# Patient Record
Sex: Male | Born: 1955 | Race: White | Hispanic: No | Marital: Married | Smoking: Former smoker
Health system: Southern US, Community
[De-identification: ages and names within clinical notes are randomized; demographics above are authoritative.]

## PROBLEM LIST (undated history)

## (undated) DIAGNOSIS — Q600 Renal agenesis, unilateral: Secondary | ICD-10-CM

## (undated) DIAGNOSIS — C801 Malignant (primary) neoplasm, unspecified: Secondary | ICD-10-CM

## (undated) DIAGNOSIS — S060XAA Concussion with loss of consciousness status unknown, initial encounter: Secondary | ICD-10-CM

## (undated) HISTORY — PX: OTHER SURGICAL HISTORY: SHX169

## (undated) HISTORY — PX: TONSILLECTOMY: SUR1361

---

## 2013-08-07 ENCOUNTER — Emergency Department (HOSPITAL_COMMUNITY)
Admission: EM | Admit: 2013-08-07 | Discharge: 2013-08-07 | Disposition: A | Discharge status: Home / Self Care | Payer: BC Managed Care – PPO | Attending: Emergency Medicine | Admitting: Emergency Medicine

## 2013-08-07 ENCOUNTER — Emergency Department (HOSPITAL_COMMUNITY): Payer: BC Managed Care – PPO

## 2013-08-07 ENCOUNTER — Encounter (HOSPITAL_COMMUNITY): Payer: Self-pay | Admitting: Emergency Medicine

## 2013-08-07 DIAGNOSIS — R1031 Right lower quadrant pain: Secondary | ICD-10-CM | POA: Insufficient documentation

## 2013-08-07 DIAGNOSIS — Z79899 Other long term (current) drug therapy: Secondary | ICD-10-CM | POA: Insufficient documentation

## 2013-08-07 DIAGNOSIS — R1032 Left lower quadrant pain: Secondary | ICD-10-CM | POA: Insufficient documentation

## 2013-08-07 DIAGNOSIS — F17200 Nicotine dependence, unspecified, uncomplicated: Secondary | ICD-10-CM | POA: Insufficient documentation

## 2013-08-07 DIAGNOSIS — M549 Dorsalgia, unspecified: Secondary | ICD-10-CM | POA: Insufficient documentation

## 2013-08-07 DIAGNOSIS — R109 Unspecified abdominal pain: Secondary | ICD-10-CM

## 2013-08-07 DIAGNOSIS — R110 Nausea: Secondary | ICD-10-CM | POA: Insufficient documentation

## 2013-08-07 LAB — COMPREHENSIVE METABOLIC PANEL
ALK PHOS: 89 U/L (ref 39–117)
ALT: 17 U/L (ref 0–53)
AST: 16 U/L (ref 0–37)
Albumin: 4.2 g/dL (ref 3.5–5.2)
BUN: 13 mg/dL (ref 6–23)
CALCIUM: 9.2 mg/dL (ref 8.4–10.5)
CO2: 22 meq/L (ref 19–32)
Chloride: 102 mEq/L (ref 96–112)
Creatinine, Ser: 0.87 mg/dL (ref 0.50–1.35)
GFR calc Af Amer: >90 mL/min (ref >90)
GFR calc non Af Amer: >90 mL/min (ref >90)
Glucose, Bld: 136 mg/dL — ABNORMAL HIGH (ref 70–99)
POTASSIUM: 3.5 meq/L — AB (ref 3.7–5.3)
SODIUM: 140 meq/L (ref 137–147)
Total Bilirubin: 0.5 mg/dL (ref 0.3–1.2)
Total Protein: 7.2 g/dL (ref 6.0–8.3)

## 2013-08-07 LAB — CBC WITH DIFFERENTIAL/PLATELET
BASOS ABS: 0 10*3/uL (ref 0.0–0.1)
Basophils Relative: 0 % (ref 0–1)
EOS PCT: 0 % (ref 0–5)
Eosinophils Absolute: 0.1 10*3/uL (ref 0.0–0.7)
HCT: 51.3 % (ref 39.0–52.0)
Hemoglobin: 17.7 g/dL — ABNORMAL HIGH (ref 13.0–17.0)
LYMPHS PCT: 8 % — AB (ref 12–46)
Lymphs Abs: 1.5 10*3/uL (ref 0.7–4.0)
MCH: 31.8 pg (ref 26.0–34.0)
MCHC: 34.5 g/dL (ref 30.0–36.0)
MCV: 92.1 fL (ref 78.0–100.0)
Monocytes Absolute: 0.9 10*3/uL (ref 0.1–1.0)
Monocytes Relative: 5 % (ref 3–12)
NEUTROS ABS: 16.3 10*3/uL — AB (ref 1.7–7.7)
Neutrophils Relative %: 87 % — ABNORMAL HIGH (ref 43–77)
PLATELETS: 201 10*3/uL (ref 150–400)
RBC: 5.57 MIL/uL (ref 4.22–5.81)
RDW: 13 % (ref 11.5–15.5)
WBC: 18.7 10*3/uL — AB (ref 4.0–10.5)

## 2013-08-07 LAB — URINE MICROSCOPIC-ADD ON

## 2013-08-07 LAB — URINALYSIS, ROUTINE W REFLEX MICROSCOPIC
Bilirubin Urine: NEGATIVE
GLUCOSE, UA: NEGATIVE mg/dL
Ketones, ur: 40 mg/dL — AB
LEUKOCYTES UA: NEGATIVE
Nitrite: NEGATIVE
Protein, ur: 100 mg/dL — AB
Specific Gravity, Urine: 1.017 (ref 1.005–1.030)
Urobilinogen, UA: 0.2 mg/dL (ref 0.0–1.0)
pH: 6 (ref 5.0–8.0)

## 2013-08-07 LAB — LIPASE, BLOOD: Lipase: 41 U/L (ref 11–59)

## 2013-08-07 LAB — TROPONIN I: Troponin I: <0.3 ng/mL (ref <0.30)

## 2013-08-07 MED ORDER — IOHEXOL 300 MG/ML  SOLN
50.0000 mL | Freq: Once | INTRAMUSCULAR | Status: AC | PRN
  Administered 2013-08-07: 50 mL via ORAL

## 2013-08-07 MED ORDER — ONDANSETRON HCL 4 MG PO TABS: 4.0000 mg | ORAL_TABLET | Freq: Four times a day (QID) | ORAL | Status: DC

## 2013-08-07 MED ORDER — IOHEXOL 300 MG/ML  SOLN
100.0000 mL | Freq: Once | INTRAMUSCULAR | Status: AC | PRN
  Administered 2013-08-07: 100 mL via INTRAVENOUS

## 2013-08-07 MED ORDER — HYDROMORPHONE HCL PF 1 MG/ML IJ SOLN
0.5000 mg | Freq: Once | INTRAMUSCULAR | Status: AC
  Administered 2013-08-07: 0.5 mg via INTRAVENOUS
  Filled 2013-08-07: qty 1

## 2013-08-07 MED ORDER — SODIUM CHLORIDE 0.9 % IV BOLUS (SEPSIS)
1000.0000 mL | Freq: Once | INTRAVENOUS | Status: AC
  Administered 2013-08-07: 1000 mL via INTRAVENOUS

## 2013-08-07 MED ORDER — HYDROCODONE-ACETAMINOPHEN 5-325 MG PO TABS: 1.0000 | ORAL_TABLET | Freq: Four times a day (QID) | ORAL | Status: DC | PRN

## 2013-08-07 MED ORDER — ONDANSETRON HCL 4 MG/2ML IJ SOLN
4.0000 mg | Freq: Once | INTRAMUSCULAR | Status: AC
  Administered 2013-08-07: 4 mg via INTRAVENOUS
  Filled 2013-08-07: qty 2

## 2013-08-07 NOTE — ED Notes (Signed)
Pt states that he has a hx of lower back "problems" and states that he has had lower back pain x 2 days; pt states that the pain has gotten progressively worse and has began to radiate down his left leg; pt states that he was unable to get comfortable and began to have abd pain last pm; pt states last BM was 1/22 am and was normal; pt c/o nausea but denies vomiting; pt states that his abd feels "blocked" and states "it feels like nothing is getting by"

## 2013-08-07 NOTE — Discharge Instructions (Signed)
Please call your doctor for a followup appointment within 24-48 hours. When you talk to your doctor please let them know that you were seen in the emergency department and have them acquire all of your records so that they can discuss the findings with you and formulate a treatment plan to fully care for your new and ongoing problems. Please call and set-up an appointment with [facility] to be re-assessed Please rest and stay hydrated Please avoid foods that are high in fat increase Please take medications as prescribed rash while on pain medications his be no drinking alcohol, driving, operating any heavy machinery. If extra please dispose in a proper manner. Please do not take any Tylenol for this can lead to Tylenol overdose and liver issues. Please continue to monitor symptoms closely and if symptoms are to worsen or change (fever greater than 101, chills, neck pain, neck stiffness, chest pain, shortness of breath, difficulty breathing, nausea, vomiting, worsening stomach pain, blood in stools, black tarry stools, not passing gas, not passing stools) or if symptoms do not get better within 24-48 hours please report back to the ED immediately  Abdominal Pain, Adult Many things can cause abdominal pain. Usually, abdominal pain is not caused by a disease and will improve without treatment. It can often be observed and treated at home. Your health care provider will do a physical exam and possibly order blood tests and X-rays to help determine the seriousness of your pain. However, in many cases, more time must pass before a clear cause of the pain can be found. Before that point, your health care provider may not know if you need more testing or further treatment. HOME CARE INSTRUCTIONS  Monitor your abdominal pain for any changes. The following actions may help to alleviate any discomfort you are experiencing:  Only take over-the-counter or prescription medicines as directed by your health care  provider.  Do not take laxatives unless directed to do so by your health care provider.  Try a clear liquid diet (broth, tea, or water) as directed by your health care provider. Slowly move to a bland diet as tolerated. SEEK MEDICAL CARE IF:  You have unexplained abdominal pain.  You have abdominal pain associated with nausea or diarrhea.  You have pain when you urinate or have a bowel movement.  You experience abdominal pain that wakes you in the night.  You have abdominal pain that is worsened or improved by eating food.  You have abdominal pain that is worsened with eating fatty foods. SEEK IMMEDIATE MEDICAL CARE IF:   Your pain does not go away within 2 hours.  You have a fever.  You keep throwing up (vomiting).  Your pain is felt only in portions of the abdomen, such as the right side or the left lower portion of the abdomen.  You pass bloody or black tarry stools. MAKE SURE YOU:  Understand these instructions.   Will watch your condition.   Will get help right away if you are not doing well or get worse.  Document Released: 04/11/2005 Document Revised: 04/22/2013 Document Reviewed: 03/11/2013 [facility] Patient Information 2014 [place], [place].   Emergency Department Resource Guide 1) Find a Doctor and Pay Out of Pocket Although you won't have to find out who is covered by your insurance plan, it is a good idea to ask around and get recommendations. You will then need to call the office and see if the doctor you have chosen will accept you as a new patient  and what types of options they offer for patients who are self-pay. Some doctors offer discounts or will set up payment plans for their patients who do not have insurance, but you will need to ask so you aren't surprised when you get to your appointment.  2) Contact Your Local Health Department Not all health departments have doctors that can see patients for sick visits, but many do, so it is worth a call to see  if yours does. If you don't know where your local health department is, you can check in your phone book. The CDC also has a tool to help you locate your state's health department, and many state websites also have listings of all of their local health departments.  3) Find a Walk-in Clinic If your illness is not likely to be very severe or complicated, you may want to try a walk in clinic. These are popping up all over the country in pharmacies, drugstores, and shopping centers. They're usually staffed by nurse practitioners or physician assistants that have been trained to treat common illnesses and complaints. They're usually fairly quick and inexpensive. However, if you have serious medical issues or chronic medical problems, these are probably not your best option.  No Primary Care Doctor: - Call Health Connect at  [phone] - they can help you locate a primary care doctor that  accepts your insurance, provides certain services, etc. - Physician Referral Service- [phone]  Chronic Pain Problems: Organization         Address  Phone   Notes  [facility]  [phone] Patients need to be referred by their primary care doctor.   Medication Assistance: Organization         Address  Phone   Notes  [facility] Passistance Program [address] [place], [place] [address] [phone] --Must be a resident of [facility] -- Must have NO insurance coverage whatsoever (no Medicaid/ Medicare, etc.) -- The pt. MUST have a primary care doctor that directs their care regularly and follows them in the community   MedAssist  [phone]   [employer]  [phone]    Agencies that provide inexpensive medical care: Organization         Address  Phone   Notes  [facility]  [phone]   [facility]    [phone]   [facility] [address] [place], [place] [address] [phone]  [phone]   [facility] [address], [place] [phone]   Planned Parenthood    [phone]) [phone]   [facility]    [phone]   [facility]  [address], [place] Phone:  [phone], Fax:  [phone] Hours of Operation:  9 am - 6 pm, M-F.  Also accepts Medicaid/Medicare and self-pay.  [facility]  [address],  Phone: [phone], Fax: [phone]. Hours of Operation:  8:30 am - 5:30 pm, M-F.  Also accepts Medicaid and self-pay.  [facility] [place] [address], [place] Phone: [phone]) [phone]   [facility] [address], [place], [place] [phone], Ext. [phone] Mondays & Thursdays: 7-9 AM.  First 15 patients are seen on a first come, first serve basis.    Medicaid-accepting [facility] Providers:  Retail buyerrganization         Address  Phone  Notes  [facility] [address], [place] [phone] Also accepts self-pay patients.  [facility] [address], [place]  [phone]   [place], [address], [place] [phone]   [facility] [address], [place] [phone]   Lucianne Lei [address], [place]   [phone] Only accepts [place] Access [place] patients after they have their name applied to their card.   Self-Pay (no insurance) in [facility]:  Organization         Address  Phone   Notes  Sickle Cell Patients, [facility] [place] [phone]   [facility] [place] [phone]   [facility] [place]  [place], [place], [place] [phone]   [facility]/Dr. Osei-Bonsu  [address] [place] or [address], [place] [phone] Phone number for both [place] and [place] locations is the same.  Urgent Medical and [facility] [address], [place] [phone]   [facility] [address] [place] or [address] [phone]   [facility] [address], [place] [phone], phone; [phone], fax Sees patients 1st and 3rd Saturday of every month.  Must not qualify for public or private insurance (i.e. Medicaid, Medicare, [place] Health Choice, Veterans' Benefits)  Household income should be no more than 200% of the poverty level The clinic cannot treat you if you are pregnant or think you are pregnant  Sexually transmitted diseases are not treated at the clinic.    Dental Care: Organization         Address  Phone  Notes  [facility] [place] [phone] Accepts children up to age 77 who are enrolled in [place] or [place]; pregnant women with a Medicaid card; and children who have applied for Medicaid or [place] Health Choice, but were declined, whose parents can pay a reduced fee at time of service.  [facility] Department of [facility]  [address], [place] [phone] Accepts children up to age 71 who are enrolled in [place] or [place]; pregnant women with a Medicaid card; and children who have applied for Medicaid or [place] Health Choice, but were declined, whose parents can pay a reduced fee at time of service.  [place] Adult Dental Access PROGRAM  [place] [phone] Patients are seen by appointment only. Walk-ins are not accepted. [place] will see patients 65 years of age and older. Monday - Tuesday (8am-5pm) Most Wednesdays (8:30-5pm) $30 per visit, cash only  [facility] Adult Dental Access PROGRAM  [address], [facility] [phone] Patients are  seen by appointment only. Walk-ins are not accepted. [place] will see patients 80 years of age and older. One Wednesday Evening (Monthly: Volunteer Based).  $30 per visit, cash only  [place]  [phone] for adults; Children under age 54, call Graduate Pediatric Dentistry at [phone]. Children aged 68-14, please call [phone] to request a pediatric application.  Dental services are provided in all areas of dental care including fillings, crowns and bridges, complete  and partial dentures, implants, gum treatment, root canals, and extractions. Preventive care is also provided. Treatment is provided to both adults and children. Patients are selected via a lottery and there is often a waiting list.   [facility] [address] [place]  [phone] [email or web address]   [facility] [address] [place], [place] [phone], Ext. [phone] Second and Fourth Thursday of each month, opens at 6:30 AM; Clinic ends at 9 AM.  Patients are seen on a first-come first-served basis, and a limited number are seen during each clinic.   [facility]  [address] Hillard Danker [place], [place] [phone]   Eligibility Requirements You must have lived in [place], [place], or [place] counties for at least the last three months.   You cannot be eligible for state or federal sponsored [employer], including [employer], [place], or [employer].   You generally cannot be eligible for healthcare insurance through your employer.    How to apply: Eligibility screenings are held every Tuesday and Wednesday afternoon from 1:00 pm until 4:00 pm. You do not need an appointment for the interview!  [facility] [address] [place], [place]   [place]  [place] Department  [place]  [phone]     Behavioral Health Resources in the Community: Intensive Outpatient Programs Organization         Address  Phone  Notes  [place]. [address] [place], [place] [phone]   [facility] Outpatient [address], [place], [place]   ADS: Alcohol [facility] Drug S[facility] [address], [place], [place]   [place] [address]  [place], [place] or [phone]   Substance Abuse Resources Organization         Address  Phone  Notes  Alcohol and Drug Services  [phone]   [place]  [phone]   [facility] [place]   Chinita Pester  [phone]   Residential & Outpatient Substance Abuse Program  [phone]   Psychological Services Organization         Address  Phone  Notes  [facility] [place]  [place]  [phone]   [place] [address], [place] or [phone]    Mobile Crisis Teams Organization         Address  Phone  Notes  [facility], Mobile Crisis Care Unit  [phone]   [facility] Psychotherapeutic Services  [address] [place], [place]   [place] [address] [place] [phone]    Self-Help/Support Groups Organization         Address  Phone             Notes  [place]. of [place] - variety of support groups  [place] Call for more information  Narcotics Anonymous (NA), [facility] [address], [employer] [place]  2 meetings at this location   Special educational needs teacher         Address  Phone  Notes  [facility] Residential Treatment [place],    [place]  [place]  [address] [place] 629476, [place], [place]   [place], [place] [phone] Admissions: 8am-3pm M-F  Incentives  Substance [place] [address]    [place], [place] [phone]   [facility] [address] [place], [place], [place]   [facility] [address]  [place], [place]   [facility]ight Programs - Intensive Outpatient [address] Kristeen [address], [place], [place]   [facility].,  [place], [place] [phone] or [phone]   [facility] Services (RTS) [address], [place], [place] Accepts Medicaid  [facility] [address],  [place] [phone] Substance Abuse/Addiction Treatment   [facility] Organization         Address  Phone  Notes  [facility]  [phone]   Domenic Schwab, PhD [address] Arlis Porta [place], [place]   [phone] or [phone]   [place] Clarissa [place], [place] [phone]   [facility] [address], [place], [place] [phone] Insurance/Medicaid/sponsorship through [facility] and Families [address], [address] [place]                                    [place], [place] [phone] [place] [address], [place] [phone]    Dr. Adele Schilder  [phone]   [facility] Dept. 1) [address]  2) [place] 3)  [place] 65, [place] [phone]  [phone]   [place] [phone] or [phone] (After Hours)

## 2013-08-07 NOTE — ED Provider Notes (Signed)
CSN: [id number]     Arrival date & time 08/07/13  0540 History   First MD Initiated Contact with Patient 08/07/13 [phone]     Chief Complaint  Patient presents with  . Back Pain  . Abdominal Pain   (Consider location/radiation/quality/duration/timing/severity/associated sxs/prior Treatment) The history is provided by the patient. No language interpreter was used.  Jerquan Bleyer is a 58 year old male with no known significant past medical history presenting to emergency department with abdominal pain and back pain that started on Tuesday. Patient reported that he has history of intermittent back pain, reported that he "damaged" his lower back a couple of years ago. Stated that the back pain at first was described as a "twinge" localized to the left side, but has now increased to sharp pain going up his back and radiating down his left leg posteriorly. Patient reported his abdominal pain is described as a tightness, patient reported that "there is a blockage" - reported that he has not heard any "gurgling." Reported that the pain is localized to the left lower quadrant. Patient reported that the back pain radiates to the abdomen. Stated that he's been using ibuprofen with minimal relief. Stated that he had a normal bowel movement yesterday, 08/06/2013. Denied melena, constipation, hematochezia, emesis, chest pain, shortness of breath, difficulty breathing, dizziness, kidney stone history. PCP Dr. Deatra Ina  History reviewed. No pertinent past medical history. History reviewed. No pertinent past surgical history. History reviewed. No pertinent family history. History  Substance Use Topics  . Smoking status: Current Every Day Smoker -- 1.00 packs/day    Types: Cigarettes  . Smokeless tobacco: Not on file  . Alcohol Use: Yes     Comment: occc    Review of Systems  Constitutional: Negative for fever and chills.  Respiratory: Negative for chest tightness and shortness of breath.   Cardiovascular:  Negative for chest pain.  Gastrointestinal: Positive for nausea and abdominal pain. Negative for vomiting, diarrhea, constipation, blood in stool and anal bleeding.  Musculoskeletal: Positive for back pain. Negative for myalgias.  Neurological: Negative for dizziness, weakness and headaches.  All other systems reviewed and are negative.    Allergies  Review of patient's allergies indicates no known allergies.  Home Medications   Current Outpatient Rx  Name  Route  Sig  Dispense  Refill  . HYDROcodone-acetaminophen (NORCO/VICODIN) 5-325 MG per tablet   Oral   Take 1 tablet by mouth every 6 (six) hours as needed for moderate pain.   5 tablet   0   . ondansetron (ZOFRAN) 4 MG tablet   Oral   Take 1 tablet (4 mg total) by mouth every 6 (six) hours.   12 tablet   0    BP 123/66  Pulse 56  Temp(Src) 98 F (36.7 C) (Oral)  Resp 16  Ht 5\' 8"  (1.727 m)  Wt 155 lb (70.308 kg)  BMI 23.57 kg/m2  SpO2 96% Physical Exam  Nursing note and vitals reviewed. Constitutional: He is oriented to person, place, and time. He appears well-developed and well-nourished. No distress.  HENT:  Head: Normocephalic and atraumatic.  Mouth/Throat: No oropharyngeal exudate.  Dry mucous membranes  Eyes: Conjunctivae and EOM are normal. Pupils are equal, round, and reactive to light. Right eye exhibits no discharge. Left eye exhibits no discharge.  Neck: Normal range of motion. Neck supple.  Negative neck stiffness Negative nuchal rigidity Negative meningeal sign  Cardiovascular: Normal rate, regular rhythm and normal heart sounds.  Exam reveals no friction rub.  No murmur heard. Pulses:      Radial pulses are 2+ on the right side, and 2+ on the left side.       Dorsalis pedis pulses are 2+ on the right side, and 2+ on the left side.  Pulmonary/Chest: Effort normal and breath sounds normal. No respiratory distress. He has no wheezes. He has no rales.  Abdominal: Soft. Normal appearance and bowel  sounds are normal. There is tenderness in the right lower quadrant and left lower quadrant. There is guarding. There is no rigidity and negative Murphy's sign.    Bowel sounds normoactive Tenderness upon palpation to right lower and left lower quadrant, most discomfort upon palpation to left lower quadrant Soft upon palpation  Musculoskeletal: Normal range of motion. He exhibits tenderness.       Back:  Negative swelling, erythema, inflammation, ecchymosis, bulging, deformities noted to cervical/thoracic/lumbosacral/coccyx of the mid spine. Discomfort upon palpation to lumbosacral/coccyx the mid spine-discomfort upon the left paraspinal region. Full range of motion to lower extremities without difficulty or ataxia noted. Full range of motion to upper extremities bilaterally.  Lymphadenopathy:    He has no cervical adenopathy.  Neurological: He is alert and oriented to person, place, and time. No cranial nerve deficit. He exhibits normal muscle tone. Coordination normal.  Cranial nerves III-XII grossly intact Strength 5+/5+ to upper and lower extremities bilaterally with resistance applied, equal distribution noted Sensation intact to upper and lower extremities bilaterally with differentiation to sharp and dull touch  Skin: Skin is warm and dry. No rash noted. He is not diaphoretic. No erythema.  Psychiatric: He has a normal mood and affect. His behavior is normal. Thought content normal.    ED Course  Procedures (including critical care time)  9:03 AM This provider re-assessed patient. Reported that his back pain has improved, but his abdomen still continues to feel "full."   11:06 AM This provider re-assessed the patient. Patient appears to be doing well. Discussed labs in great detail - patient understood. Patient reported abdominal pain of 5/10 and back pain of 7/10. Reported that dilaudid aided in his relief.   12:37 PM This provider re-assessed patient. Patient reported that he no  longer had any pain. BS normoactive and negative pain upon palpation. Patient reported that he has been passing gas and feels better. Patient able to tolerate food and fluids PO without difficulty or episodes of emesis. Denied chest pain, shortness of breath, difficulty breathing, nausea, vomiting, diarrhea.   Results for orders placed during the hospital encounter of 08/07/13  CBC WITH DIFFERENTIAL      Result Value Range   WBC 18.7 (*) 4.0 - 10.5 K/uL   RBC 5.57  4.22 - 5.81 MIL/uL   Hemoglobin 17.7 (*) 13.0 - 17.0 g/dL   HCT 51.3  39.0 - 52.0 %   MCV 92.1  78.0 - 100.0 fL   MCH 31.8  26.0 - 34.0 pg   MCHC 34.5  30.0 - 36.0 g/dL   RDW 13.0  11.5 - 15.5 %   Platelets 201  150 - 400 K/uL   Neutrophils Relative % 87 (*) 43 - 77 %   Neutro Abs 16.3 (*) 1.7 - 7.7 K/uL   Lymphocytes Relative 8 (*) 12 - 46 %   Lymphs Abs 1.5  0.7 - 4.0 K/uL   Monocytes Relative 5  3 - 12 %   Monocytes Absolute 0.9  0.1 - 1.0 K/uL   Eosinophils Relative 0  0 - 5 %  Eosinophils Absolute 0.1  0.0 - 0.7 K/uL   Basophils Relative 0  0 - 1 %   Basophils Absolute 0.0  0.0 - 0.1 K/uL  COMPREHENSIVE METABOLIC PANEL      Result Value Range   Sodium 140  137 - 147 mEq/L   Potassium 3.5 (*) 3.7 - 5.3 mEq/L   Chloride 102  96 - 112 mEq/L   CO2 22  19 - 32 mEq/L   Glucose, Bld 136 (*) 70 - 99 mg/dL   BUN 13  6 - 23 mg/dL   Creatinine, Ser 0.87  0.50 - 1.35 mg/dL   Calcium 9.2  8.4 - 10.5 mg/dL   Total Protein 7.2  6.0 - 8.3 g/dL   Albumin 4.2  3.5 - 5.2 g/dL   AST 16  0 - 37 U/L   ALT 17  0 - 53 U/L   Alkaline Phosphatase 89  39 - 117 U/L   Total Bilirubin 0.5  0.3 - 1.2 mg/dL   GFR calc non Af Amer >90  >90 mL/min   GFR calc Af Amer >90  >90 mL/min  LIPASE, BLOOD      Result Value Range   Lipase 41  11 - 59 U/L  TROPONIN I      Result Value Range   Troponin I <0.30  <0.30 ng/mL  URINALYSIS, ROUTINE W REFLEX MICROSCOPIC      Result Value Range   Color, Urine YELLOW  YELLOW   APPearance CLEAR  CLEAR     Specific Gravity, Urine 1.017  1.005 - 1.030   pH 6.0  5.0 - 8.0   Glucose, UA NEGATIVE  NEGATIVE mg/dL   Hgb urine dipstick SMALL (*) NEGATIVE   Bilirubin Urine NEGATIVE  NEGATIVE   Ketones, ur 40 (*) NEGATIVE mg/dL   Protein, ur 100 (*) NEGATIVE mg/dL   Urobilinogen, UA 0.2  0.0 - 1.0 mg/dL   Nitrite NEGATIVE  NEGATIVE   Leukocytes, UA NEGATIVE  NEGATIVE  URINE MICROSCOPIC-ADD ON      Result Value Range   Squamous Epithelial / LPF RARE  RARE   Bacteria, UA RARE  RARE   Ct Abdomen Pelvis W Contrast  08/07/2013   CLINICAL DATA:  Low back pain, abdominal pain, nausea, elevated white count  EXAM: CT ABDOMEN AND PELVIS WITH CONTRAST  TECHNIQUE: Multidetector CT imaging of the abdomen and pelvis was performed using the standard protocol following bolus administration of intravenous contrast.  CONTRAST:  118mL OMNIPAQUE IOHEXOL 300 MG/ML  SOLN  COMPARISON:  None.  FINDINGS: Minor dependent basilar atelectasis. Lung bases clear. No visualized lower lobe pneumonia. No pleural or pericardial effusion. Normal heart size. No hiatal hernia.  Abdomen: Liver, gallbladder, biliary system, pancreas, spleen, and adrenal glands are within normal limits for age and demonstrate no acute process. Solitary left kidney noted with secondary hypertrophy. No renal obstruction or hydronephrosis. Incidental sub cm left renal cortical cysts noted in the mid to lower pole regions.  No abdominal free fluid, fluid collection, hemorrhage, abscess, or adenopathy.  Negative for bowel obstruction, dilatation, ileus, or free air.  Normal appendix demonstrated.  Aortic atherosclerosis noted without aneurysm. No acute or abnormal vascular finding.  Pelvis: No pelvic free fluid, fluid collection, hemorrhage, abscess, adenopathy, inguinal abnormality, or hernia. Prostate gland is mildly enlarged. No acute distal bowel process.  Minor degenerative changes of the lower lumbar spine with facet arthropathy.  IMPRESSION: No acute  intra-abdominal or pelvic finding.  Solitary left kidney with small incidental sub  cm cortical cysts  Aortic atherosclerosis without aneurysm  Basilar atelectasis   Electronically Signed   By: Daryll Brod M.D.   On: 08/07/2013 09:02   US Aorta  08/07/2013   CLINICAL DATA:  Pain radiating to back  EXAM: ULTRASOUND OF ABDOMINAL AORTA  TECHNIQUE: Ultrasound examination of the abdominal aorta was performed to evaluate for potential abdominal aortic aneurysm.  COMPARISON:  None.  FINDINGS: The abdominal aorta has a maximum transverse diameter of 2.5 x 2.0 cm. There is no abdominal aortic aneurysm. There is no periaortic fluid or adenopathy. Gas obscures the proximal iliac arteries.  IMPRESSION: No demonstrable abdominal aortic aneurysm.   Electronically Signed   By: Lowella Grip M.D.   On: 08/07/2013 07:56   Labs Review Labs Reviewed  CBC WITH DIFFERENTIAL - Abnormal; Notable for the following:    WBC 18.7 (*)    Hemoglobin 17.7 (*)    Neutrophils Relative % 87 (*)    Neutro Abs 16.3 (*)    Lymphocytes Relative 8 (*)    All other components within normal limits  COMPREHENSIVE METABOLIC PANEL - Abnormal; Notable for the following:    Potassium 3.5 (*)    Glucose, Bld 136 (*)    All other components within normal limits  URINALYSIS, ROUTINE W REFLEX MICROSCOPIC - Abnormal; Notable for the following:    Hgb urine dipstick SMALL (*)    Ketones, ur 40 (*)    Protein, ur 100 (*)    All other components within normal limits  LIPASE, BLOOD  TROPONIN I  URINE MICROSCOPIC-ADD ON   Imaging Review Ct Abdomen Pelvis W Contrast  08/07/2013   CLINICAL DATA:  Low back pain, abdominal pain, nausea, elevated white count  EXAM: CT ABDOMEN AND PELVIS WITH CONTRAST  TECHNIQUE: Multidetector CT imaging of the abdomen and pelvis was performed using the standard protocol following bolus administration of intravenous contrast.  CONTRAST:  143mL OMNIPAQUE IOHEXOL 300 MG/ML  SOLN  COMPARISON:  None.  FINDINGS:  Minor dependent basilar atelectasis. Lung bases clear. No visualized lower lobe pneumonia. No pleural or pericardial effusion. Normal heart size. No hiatal hernia.  Abdomen: Liver, gallbladder, biliary system, pancreas, spleen, and adrenal glands are within normal limits for age and demonstrate no acute process. Solitary left kidney noted with secondary hypertrophy. No renal obstruction or hydronephrosis. Incidental sub cm left renal cortical cysts noted in the mid to lower pole regions.  No abdominal free fluid, fluid collection, hemorrhage, abscess, or adenopathy.  Negative for bowel obstruction, dilatation, ileus, or free air.  Normal appendix demonstrated.  Aortic atherosclerosis noted without aneurysm. No acute or abnormal vascular finding.  Pelvis: No pelvic free fluid, fluid collection, hemorrhage, abscess, adenopathy, inguinal abnormality, or hernia. Prostate gland is mildly enlarged. No acute distal bowel process.  Minor degenerative changes of the lower lumbar spine with facet arthropathy.  IMPRESSION: No acute intra-abdominal or pelvic finding.  Solitary left kidney with small incidental sub cm cortical cysts  Aortic atherosclerosis without aneurysm  Basilar atelectasis   Electronically Signed   By: Daryll Brod M.D.   On: 08/07/2013 09:02   US Aorta  08/07/2013   CLINICAL DATA:  Pain radiating to back  EXAM: ULTRASOUND OF ABDOMINAL AORTA  TECHNIQUE: Ultrasound examination of the abdominal aorta was performed to evaluate for potential abdominal aortic aneurysm.  COMPARISON:  None.  FINDINGS: The abdominal aorta has a maximum transverse diameter of 2.5 x 2.0 cm. There is no abdominal aortic aneurysm. There is no periaortic fluid or  adenopathy. Gas obscures the proximal iliac arteries.  IMPRESSION: No demonstrable abdominal aortic aneurysm.   Electronically Signed   By: Lowella Grip M.D.   On: 08/07/2013 07:56    EKG Interpretation    Date/Time:  Friday August 07 2013 07:14:52  EST Ventricular Rate:  63 PR Interval:  124 QRS Duration: 112 QT Interval:  430 QTC Calculation: 440 R Axis:   110 Text Interpretation:  Sinus arrhythmia Biatrial enlargement IRBBB and LPFB Confirmed by ALLEN  MD, ANTHONY ([id number]) on 08/07/2013 9:53:38 AM            MDM   1. Abdominal pain   2. Back pain     Medications  ondansetron (ZOFRAN) injection 4 mg (4 mg Intravenous Given 08/07/13 0626)  sodium chloride 0.9 % bolus 1,000 mL (0 mLs Intravenous Stopped 08/07/13 0920)  HYDROmorphone (DILAUDID) injection 0.5 mg (0.5 mg Intravenous Given 08/07/13 0714)  iohexol (OMNIPAQUE) 300 MG/ML solution 50 mL (50 mLs Oral Contrast Given 08/07/13 0741)  iohexol (OMNIPAQUE) 300 MG/ML solution 100 mL (100 mLs Intravenous Contrast Given 08/07/13 0839)  HYDROmorphone (DILAUDID) injection 0.5 mg (0.5 mg Intravenous Given 08/07/13 1128)   Filed Vitals:   08/07/13 0541 08/07/13 0904 08/07/13 1235  BP: 153/86 127/55 123/66  Pulse: 60 68 56  Temp: 98 F (36.7 C) 98.6 F (37 C) 98 F (36.7 C)  TempSrc: Oral Oral Oral  Resp: 16 14 16   Height: 5\' 8"  (1.727 m)    Weight: 155 lb (70.308 kg)    SpO2: 98% 95% 96%    Patient presenting to emergency department with abdominal pain and back pain that started on Tuesday. Reported that the back pain started off as a "twinge" that has now increased to a sharp shooting pain radiating up and down the left posterior aspect of the leg. Reported abdominal pain is described as a tightness-reported that he feels as if "there is a blockage." Stated that he's been using ibuprofen with minimal relief. Stated that he has not had any gas today. Reported normal bowel movement yesterday. Alert and oriented. GCS 15. Heart rate and rhythm normal. Lungs clear to auscultation to upper and lower lobes bilaterally. Radial and DP pulses 2+ bilaterally. Bowel sounds normoactive in all 4 quadrants-soft-discomfort upon palpation to right and left lower quadrant, most discomfort upon  palpation to left lower quadrant. Negative Murphy's. Negative McBurney's. Full range of motion to upper and lower extremities bilaterally without difficulty noted. Negative ataxia. Negative deformities noted to the lumbar spine. Discomfort upon palpation to the mid lumbosacral spine/coccyx and left paraspinal region of the lumbosacral spine. Full range of motion to lower extremities. Strength intact with resistance applied, equal distribution noted. Sensation intact. Patient neurologically intact. Urinalysis noted small amount of hemoglobin-negative nitrites or leukocytes identified. CBC noted mild elevation of white blood cells to be 18.7, elevation of neutrophils to be 16.3. CMP noted hypokalemia of 3.5. Lipase negative elevation. Ultrasound of the aorta negative for aneurysm or dissection. CT abdomen and pelvis with contrast negative acute abnormalities identified-solitary left kidney with small incidental subcentimeter cortical cysts noted. Doubt AAA. Doubt acute abdominal processes. Doubt appendicitis. Doubt colitis. Doubt SBO/LBO. Patient able to tolerate fluids and food PO without episode of emesis. Patient re-assessed and pain relieved. Patient reported abdominal pain has improved. Nonsurgical abdomen. Patient stable, afebrile. Pain controlled in ED setting. Negative episodes of emesis while in ED setting. Discharged patient. Definitive etiology of abdominal pain unknown - possible discomfort secondary to back pain with radiation to abdominal  pain. Hematuria identified-cannot rule out kidney stone. Discharge patient with small dose of pain medications discussed course, precautions, disposal technique. Referred patient to primary care provider and GI. Discussed with patient to rest and stay hydrated. Discussed with patient proper diet. Discussed with patient to closely monitor symptoms and if symptoms are to worsen or change to report back to the ED - strict return instructions given.  Patient agreed to  plan of care, understood, all questions answered.    Jamse Mead, PA-C 08/08/13 1657  Creta Dorame, PA-C 08/09/13 1554

## 2013-08-07 NOTE — ED Notes (Signed)
Pt given cup of ice water 

## 2013-08-14 NOTE — ED Provider Notes (Signed)
Medical screening examination/treatment/procedure(s) were performed by non-physician practitioner and as supervising physician I was immediately available for consultation/collaboration.  Tallulah Hosman T Bettyanne Dittman, MD 08/14/13 0936 

## 2015-09-12 ENCOUNTER — Other Ambulatory Visit: Payer: Self-pay | Admitting: Occupational Medicine

## 2015-09-12 ENCOUNTER — Ambulatory Visit: Payer: Self-pay

## 2015-09-12 DIAGNOSIS — Z0000 Encounter for general adult medical examination without abnormal findings: Secondary | ICD-10-CM

## 2017-11-20 ENCOUNTER — Ambulatory Visit
Admission: RE | Admit: 2017-11-20 | Discharge: 2017-11-20 | Disposition: A | Discharge status: Home / Self Care | Payer: Managed Care, Other (non HMO) | Source: Ambulatory Visit | Attending: Family Medicine | Admitting: Family Medicine

## 2017-11-20 ENCOUNTER — Other Ambulatory Visit: Payer: Self-pay | Admitting: Family Medicine

## 2017-11-20 DIAGNOSIS — M25512 Pain in left shoulder: Secondary | ICD-10-CM

## 2019-02-17 ENCOUNTER — Other Ambulatory Visit: Payer: Self-pay | Admitting: Family Medicine

## 2019-02-17 DIAGNOSIS — R2241 Localized swelling, mass and lump, right lower limb: Secondary | ICD-10-CM

## 2021-01-12 ENCOUNTER — Other Ambulatory Visit (HOSPITAL_COMMUNITY): Payer: Self-pay | Admitting: Urology

## 2021-01-12 DIAGNOSIS — C61 Malignant neoplasm of prostate: Secondary | ICD-10-CM

## 2021-02-03 ENCOUNTER — Ambulatory Visit (HOSPITAL_COMMUNITY)
Admission: RE | Admit: 2021-02-03 | Discharge: 2021-02-03 | Disposition: A | Discharge status: Home / Self Care | Payer: Medicare Other | Source: Ambulatory Visit | Attending: Urology | Admitting: Urology

## 2021-02-03 ENCOUNTER — Other Ambulatory Visit: Payer: Self-pay

## 2021-02-03 DIAGNOSIS — C61 Malignant neoplasm of prostate: Secondary | ICD-10-CM | POA: Diagnosis not present

## 2021-02-03 MED ORDER — PIFLIFOLASTAT F 18 (PYLARIFY) INJECTION
9.0000 | Freq: Once | INTRAVENOUS | Status: AC
  Administered 2021-02-03: 9.6 via INTRAVENOUS

## 2021-02-10 ENCOUNTER — Telehealth: Payer: Self-pay | Admitting: Radiation Oncology

## 2021-02-10 NOTE — Telephone Encounter (Signed)
Called patient to schedule consultation with Dr. Tammi Klippel. No answer, LVM for return call.

## 2021-02-13 ENCOUNTER — Other Ambulatory Visit: Payer: Self-pay | Admitting: Urology

## 2021-02-13 DIAGNOSIS — C61 Malignant neoplasm of prostate: Secondary | ICD-10-CM

## 2021-03-07 ENCOUNTER — Ambulatory Visit
Admission: RE | Admit: 2021-03-07 | Discharge: 2021-03-07 | Disposition: A | Discharge status: Home / Self Care | Payer: Medicare Other | Source: Ambulatory Visit | Attending: Urology | Admitting: Urology

## 2021-03-07 ENCOUNTER — Other Ambulatory Visit: Payer: Self-pay

## 2021-03-07 DIAGNOSIS — C61 Malignant neoplasm of prostate: Secondary | ICD-10-CM

## 2021-03-07 MED ORDER — GADOBENATE DIMEGLUMINE 529 MG/ML IV SOLN
17.0000 mL | Freq: Once | INTRAVENOUS | Status: AC | PRN
  Administered 2021-03-07: 17 mL via INTRAVENOUS

## 2021-03-07 NOTE — Progress Notes (Addendum)
GU Location of Tumor / Histology:  Adenocarcinoma of the prostate  If Prostate Cancer, Gleason Score is (4 + 3), PSA (36 as of 12/13/2020), and Prostate volume (45 g)  Andrew Hudson presented with signs/symptoms of: elevated PSA levels  Biopsies revealed:  01/03/2021   MRI Pelvis w/ & w/o Contrast 03/07/2021 --IMPRESSION: Ill-defined low T1 signal lesion in the posterior right iliac bone with minimal enhancement. This corresponds to the lesion seen on recent PET-CT. Imaging characteristics are nonspecific but metastatic disease is in the differential. NM bone scan would be useful for further evaluation. Low T2 signal lesion within the peripheral and transitional zone ofthe prostate gland compatible with prostate carcinoma.  Past/Anticipated interventions by urology, if any:  02/09/2021 (office visit) --Dr. Rexene Alberts   01/03/2021 --Dr. Rexene Alberts Transrectal ultrasound of prostate with biopsies   Past/Anticipated interventions by medical oncology, if any:  No referral placed at this time  Weight changes, if any: Patient denies  IPSS Score: 3 (mild) SHIM Score: 25 (no ED)  Bowel/Bladder complaints, if any: Denies any changes in bowel habits. Denies any new or worsening urinary symptoms. Reports he would be pleased if he had to live with his current urinary condition for the rest of his life  Nausea/Vomiting, if any: Patient denies  Pain issues, if any:  Patient denies  SAFETY ISSUES: Prior radiation? No Pacemaker/ICD? No Possible current pregnancy? N/A Is the patient on methotrexate? No  Current Complaints / other details:  Has received the first 3 Pfizer vaccines. Reports his sister who is ~61 years older than him was recently diagnosed with breast cancer

## 2021-03-08 ENCOUNTER — Encounter: Payer: Self-pay | Admitting: Radiation Oncology

## 2021-03-08 ENCOUNTER — Ambulatory Visit
Admission: RE | Admit: 2021-03-08 | Discharge: 2021-03-08 | Disposition: A | Discharge status: Home / Self Care | Payer: Medicare Other | Source: Ambulatory Visit | Attending: Radiation Oncology | Admitting: Radiation Oncology

## 2021-03-08 DIAGNOSIS — R609 Edema, unspecified: Secondary | ICD-10-CM | POA: Insufficient documentation

## 2021-03-08 DIAGNOSIS — C61 Malignant neoplasm of prostate: Secondary | ICD-10-CM

## 2021-03-08 DIAGNOSIS — F17210 Nicotine dependence, cigarettes, uncomplicated: Secondary | ICD-10-CM | POA: Diagnosis not present

## 2021-03-08 NOTE — Progress Notes (Signed)
Radiation Oncology         [phone]) [phone] ________________________________  Initial Outpatient Consultation  Name: Andrew Hudson MRN: [id number]  Date: 03/08/2021  DOB: 04/14/1956  [id number], [name], PA-C  [name], MD   REFERRING PHYSICIAN: [name], MD  DIAGNOSIS: 65 y.o. 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    ICD-10-CM   1. Malignant neoplasm of prostate ([place])  C61       HISTORY OF PRESENT ILLNESS: Andrew Hudson is a 65 y.o. male with a diagnosis of prostate cancer. He was noted to have an elevated PSA of 38 by his primary care provider, Andrew Matter, PA-C on labs drawn 09/05/2020.  Accordingly, he was referred for evaluation in urology by Dr. Abner Greenspan on 12/13/2020,  digital rectal examination was performed at that time revealing firm nodules bilaterally in the prostate.  The patient proceeded to transrectal ultrasound with 12 biopsies of the prostate on 01/03/2021.  The prostate volume measured 45 cc.  Out of 12 core biopsies, 11 were positive.  The maximum Gleason score was 4+3, and this was seen in 9 of the 11 positive cores and Gleason 3+4 was seen in the other 2 of the 11 positive cores. A PSMA PET scan was performed for disease staging on 02/03/2021 and demonstrated intense activity in the prostate gland without any concerning lymphadenopathy.  There was a questionable lesion in the right iliac and left third rib with low activity and the recommendation was for further evaluation with MRI pelvis.  The MRI pelvis was performed on 03/07/2021 and again demonstrated low activity in the right iliac lesion, felt to be indeterminant.  The patient reviewed the biopsy results with his urologist and he has kindly been referred today for discussion of potential radiation treatment options.   PREVIOUS RADIATION THERAPY: No  PAST MEDICAL HISTORY: No past medical history on file.    PAST SURGICAL HISTORY:No past surgical  history on file.  FAMILY HISTORY: No family history on file.  SOCIAL HISTORY:  Social History   Socioeconomic History   Marital status: Married    Spouse name: Not on file   Number of children: Not on file   Years of education: Not on file   Highest education level: Not on file  Occupational History   Not on file  Tobacco Use   Smoking status: Every Day    Packs/day: 1.00    Types: Cigarettes   Smokeless tobacco: Not on file  Substance and Sexual Activity   Alcohol use: Yes    Comment: occc   Drug use: Yes    Types: Marijuana   Sexual activity: Not on file  Other Topics Concern   Not on file  Social History Narrative   Not on file   Social Determinants of Health   Financial Resource Strain: Not on file  Food Insecurity: Not on file  Transportation Needs: Not on file  Physical Activity: Not on file  Stress: Not on file  Social Connections: Not on file  Intimate Partner Violence: Not on file    ALLERGIES: Patient has no known allergies.  MEDICATIONS:  Current Outpatient Medications  Medication Sig Dispense Refill   HYDROcodone-acetaminophen (NORCO/VICODIN) 5-325 MG per tablet Take 1 tablet by mouth every 6 (six) hours as needed for moderate pain. 5 tablet 0   ondansetron (ZOFRAN) 4 MG tablet Take 1 tablet (4 mg total) by mouth every 6 (six) hours. 12 tablet 0  No current facility-administered medications for this visit.    REVIEW OF SYSTEMS:  On review of systems, the patient reports that he is doing well overall. He denies any chest pain, shortness of breath, cough, fevers, chills, night sweats, unintended weight changes. He denies any bowel disturbances, and denies abdominal pain, nausea or vomiting. He denies any new musculoskeletal or joint aches or pains. His IPSS was 3, indicating mild urinary symptoms. His SHIM was 25, indicating he does not have erectile dysfunction. A complete review of systems is obtained and is otherwise negative.    PHYSICAL EXAM:   Wt Readings from Last 3 Encounters:  08/07/13 155 lb (70.3 kg)   Temp Readings from Last 3 Encounters:  08/07/13 98 F (36.7 C) (Oral)   BP Readings from Last 3 Encounters:  08/07/13 123/66   Pulse Readings from Last 3 Encounters:  08/07/13 (!) 56    /10  In general this is a well appearing Caucasian male in no acute distress. He's alert and oriented x4 and appropriate throughout the examination. Cardiopulmonary assessment is negative for acute distress, and he exhibits normal effort.     KPS = 100  100 - Normal; no complaints; no evidence of disease. 90   - Able to carry on normal activity; minor signs or symptoms of disease. 80   - Normal activity with effort; some signs or symptoms of disease. 75   - Cares for self; unable to carry on normal activity or to do active work. 60   - Requires occasional assistance, but is able to care for most of his personal needs. 50   - Requires considerable assistance and frequent medical care. 13   - Disabled; requires special care and assistance. 43   - Severely disabled; hospital admission is indicated although death not imminent. 6   - Very sick; hospital admission necessary; active supportive treatment necessary. 10   - Moribund; fatal processes progressing rapidly. 0     - Dead  Karnofsky DA, Abelmann Fill[place], Craver LS and Burchenal [facility] [phone]) The use of the nitrogen mustards in the palliative treatment of carcinoma: with particular reference to bronchogenic carcinoma Cancer 1 634-56  LABORATORY DATA:  Lab Results  Component Value Date   WBC 18.7 (H) 08/07/2013   HGB 17.7 (H) 08/07/2013   HCT 51.3 08/07/2013   MCV 92.1 08/07/2013   PLT 201 08/07/2013   Lab Results  Component Value Date   NA 140 08/07/2013   K 3.5 (L) 08/07/2013   CL 102 08/07/2013   CO2 22 08/07/2013   Lab Results  Component Value Date   ALT 17 08/07/2013   AST 16 08/07/2013   ALKPHOS 89 08/07/2013   BILITOT 0.5 08/07/2013     RADIOGRAPHY: MR PELVIS W  WO CONTRAST  Result Date: 03/08/2021 CLINICAL DATA:  Right iliac bone lesion EXAM: MRI PELVIS WITHOUT AND WITH CONTRAST TECHNIQUE: Multiplanar multisequence MR imaging of the pelvis was performed both before and after administration of intravenous contrast. CONTRAST:  60m MULTIHANCE GADOBENATE DIMEGLUMINE 529 MG/ML IV SOLN COMPARISON:  PET-CT 02/03/2021 FINDINGS: Bones/Joint/Cartilage Within the right posterior iliac bone, there is a T1 hypointense, ill-defined lesion with minimal enhancement, measuring 1.7 x 2.3 x 1.5 cm (coronal T1 image 32, axial T1 image 13). There are scattered patchy additional marrow signal changes favored to represent a changes of marrow conversion. Likely Schmorl's node along superior endplate of L4. Muscles and Tendons There is feathery edema within the left hip adductors. There is also intramuscular edema  in the right gluteus minimus and medius muscles. This is all likely reactive/mild muscle strain. Soft tissues There is smudgy low T2 signal within the peripheral zone of the prostate and within the transitional zone, compatible with prostate carcinoma. IMPRESSION: Ill-defined low T1 signal lesion in the posterior right iliac bone with minimal enhancement. This corresponds to the lesion seen on recent PET-CT. Imaging characteristics are nonspecific but metastatic disease is in the differential. NM bone scan would be useful for further evaluation. Low T2 signal lesion within the peripheral and transitional zone of the prostate gland compatible with prostate carcinoma. Electronically Signed   By: Maurine Simmering M.D.   On: 03/08/2021 11:00      IMPRESSION/PLAN: 1. 65 y.o. gentleman with Stage T2c adenocarcinoma of the prostate with Gleason Score of 4+3, and PSA of 36.2. We discussed the patient's workup and outlined the nature of prostate cancer in this setting. The patient's T stage, Gleason's score, and PSA put him into the high risk group.  We also reviewed his recent imaging which is  suspicious for oligometastatic disease in the bone, particularly in light of the significantly elevated PSA, but not definitive.  Accordingly, he is eligible for a variety of potential treatment options including prostatectomy or ADT in combination with either 8 weeks of external radiation or 5 weeks of external radiation with an upfront brachytherapy boost. We discussed the available radiation techniques, and focused on the details and logistics of delivery. We discussed and outlined the risks, benefits, short and long-term effects associated with radiotherapy and compared and contrasted these with prostatectomy. We discussed the role of SpaceOAR gel in reducing the rectal toxicity associated with radiotherapy. We also detailed the role of ADT in the treatment of high risk prostate cancer and outlined the associated side effects that could be expected with this therapy. He appears to have a good understanding of his disease and our treatment recommendations which are of curative intent.  He and his wife were encouraged to ask questions that were answered to their stated satisfaction.  At the conclusion of our conversation, the patient is interested in proceeding with a bone scan for further evaluation of the suspicious lesions in the right iliac and left third rib.  Pending findings on this study, he is most interested in moving forward with brachytherapy boost and use of SpaceOAR gel to reduce rectal toxicity from radiotherapy, followed by 5 weeks of daily IMRT to include the prostate, seminal vesicles and pelvic lymph nodes.  After lengthy discussion regarding the pros and cons of adjuvant ADT, the patient wishes to proceed with radiation treatment alone and reserve the ADT for future use if the PSA continues to rise despite treatment.  If the bony lesions are proven to be oligometastatic deposits, he is in favor of including these lesions in the course of daily IMRT.  We will share our discussion with Dr. Abner Greenspan  and I will place the order for bone scan in an effort to get this scheduled as soon as possible.  Once we have those results and pending Dr. Abner Greenspan is in agreement, we will plan to move forward with scheduling the brachytherapy boost procedure, first available and approximately 3 weeks thereafter, we will proceed with the 5-week course of daily IMRT to include the prostate, seminal vesicles, pelvic lymph nodes and potentially the bony lesions.  He appears to have a good understanding of his disease and our recommendations and is in agreement with the stated plan.  We enjoyed meeting him  today and look forward to continuing to participate in his care.  We personally spent 80 minutes in this encounter including chart review, reviewing radiological studies, meeting face-to-face with the patient, entering orders and completing documentation.    Nicholos Johns, PA-C    Tyler Pita, MD  [facility] Direct Dial: [phone]  Fax: [phone] [email or web address]  Skype  [email or web address]

## 2021-03-10 ENCOUNTER — Telehealth: Payer: Self-pay | Admitting: *Deleted

## 2021-03-10 NOTE — Telephone Encounter (Signed)
CALLED PATIENT TO INFORM OF BONE SCAN ON 03-22-21- ARRIVAL TIME- 9:45 AM @ [facility], PATIENT TO RETURN @ 1:00 PM TO BE SCANNED, LVM FOR A RETURN CALL

## 2021-03-22 ENCOUNTER — Other Ambulatory Visit: Payer: Self-pay

## 2021-03-22 ENCOUNTER — Encounter (HOSPITAL_COMMUNITY)
Admission: RE | Admit: 2021-03-22 | Discharge: 2021-03-22 | Disposition: A | Discharge status: Home / Self Care | Payer: Medicare Other | Source: Ambulatory Visit | Attending: Urology | Admitting: Urology

## 2021-03-22 DIAGNOSIS — C61 Malignant neoplasm of prostate: Secondary | ICD-10-CM | POA: Insufficient documentation

## 2021-03-22 MED ORDER — TECHNETIUM TC 99M MEDRONATE IV KIT
21.4000 | PACK | Freq: Once | INTRAVENOUS | Status: AC | PRN
  Administered 2021-03-22: 21.4 via INTRAVENOUS

## 2021-03-24 ENCOUNTER — Telehealth: Payer: Self-pay

## 2021-03-24 ENCOUNTER — Ambulatory Visit
Admission: RE | Admit: 2021-03-24 | Discharge: 2021-03-24 | Disposition: A | Discharge status: Home / Self Care | Payer: Medicare Other | Source: Ambulatory Visit | Attending: Urology | Admitting: Urology

## 2021-03-24 ENCOUNTER — Telehealth: Payer: Self-pay | Admitting: *Deleted

## 2021-03-24 ENCOUNTER — Encounter: Payer: Self-pay | Admitting: Urology

## 2021-03-24 DIAGNOSIS — C61 Malignant neoplasm of prostate: Secondary | ICD-10-CM

## 2021-03-24 NOTE — Telephone Encounter (Signed)
xxxxx 

## 2021-03-24 NOTE — Progress Notes (Signed)
I called and spoke with the patient and his wife by telephone to review the results of his recent bone scan that was performed on 03/22/2021 and shows no evidence for metastatic bone disease, specifically no focal abnormality in the right ilium which was an area of concern on his recent PET and MR imaging.  Therefore, we will proceed as planned, with brachytherapy boost and use of SpaceOAR gel to reduce rectal toxicity from radiotherapy, followed by 5 weeks of daily IMRT to include the prostate, seminal vesicles and pelvic lymph nodes.  After lengthy discussion regarding the pros and cons of adjuvant ADT, the patient wishes to proceed with radiation treatment alone and reserve the ADT for future use if the PSA continues to rise despite treatment.  I advised him that I will share this information with Dr. Abner Greenspan and we will proceed with treatment planning accordingly.  He does have a scheduled follow-up visit with Dr. Abner Greenspan on Tuesday, 03/28/2021.  They know that they are welcome to call anytime with any questions or concerns in the interim.     Nicholos Johns, MMS, PA-C [place] [facility] [place]: [phone]  Fax: [phone]

## 2021-03-24 NOTE — Telephone Encounter (Signed)
Left message in reference to patient's telephone visit today (03/24/21) at 1:00pm in attempts to pre-complete nursing portion of visit.

## 2021-03-24 NOTE — Progress Notes (Signed)
Patient reports doing well. No symptoms to report at this time.  I-PSS Score of 0. Meaningful use complete.  Patient notified of 1:00pm /03/24/21 telephone visit and expressed understanding.

## 2021-03-28 ENCOUNTER — Telehealth: Payer: Self-pay | Admitting: Urology

## 2021-03-28 NOTE — Telephone Encounter (Signed)
I received a message that the patient had some additional questions and attempted to return his call but had to leave a message on voicemail.  Nicholos Johns, MMS, PA-C [place] [facility] [place]: [phone]  Fax: [phone]

## 2021-03-29 ENCOUNTER — Telehealth: Payer: Self-pay | Admitting: *Deleted

## 2021-03-29 ENCOUNTER — Other Ambulatory Visit: Payer: Self-pay | Admitting: Urology

## 2021-03-29 DIAGNOSIS — C61 Malignant neoplasm of prostate: Secondary | ICD-10-CM

## 2021-03-29 NOTE — Telephone Encounter (Signed)
Called patient to inform of pre-seed appts. for 04/13/21 and his implant for 05-29-21, spoke with patient and he is aware of these appts.

## 2021-04-13 ENCOUNTER — Inpatient Hospital Stay (HOSPITAL_COMMUNITY): Admission: RE | Admit: 2021-04-13 | Payer: Medicare Other | Source: Ambulatory Visit

## 2021-04-13 ENCOUNTER — Ambulatory Visit: Payer: Self-pay | Admitting: Urology

## 2021-04-13 ENCOUNTER — Ambulatory Visit: Payer: Medicare Other | Admitting: Radiation Oncology

## 2021-04-26 ENCOUNTER — Telehealth: Payer: Self-pay | Admitting: *Deleted

## 2021-04-26 NOTE — Telephone Encounter (Signed)
CALLED PATIENT TO REMIND OF PRE-SEED APPTS. FOR 04-28-21, SPOKE WITH PATIENT AND HE IS AWARE OF THESE APPTS.

## 2021-04-27 NOTE — Progress Notes (Signed)
  Radiation Oncology         [phone]) [phone] ________________________________  Name: Andrew Hudson MRN: [id number]  Date: 04/28/2021  DOB: May 17, 1956  SIMULATION AND TREATMENT PLANNING NOTE PUBIC ARCH STUDY  HE:NIDPOE, [name], PA-C  [name], MD  DIAGNOSIS: 65 y.o. 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  Oncology History  Malignant neoplasm of prostate ([place])  01/03/2021 Cancer Staging   Staging form: Prostate, AJCC 8th Edition - Clinical stage from 01/03/2021: Stage IIIA (cT2c, cN0, cM0, PSA: 36.2, Grade Group: 3) - Signed by Freeman Caldron, PA-C on 03/08/2021 Histopathologic type: Adenocarcinoma, NOS Stage prefix: Initial diagnosis Prostate specific antigen (PSA) range: 20 or greater Gleason primary pattern: 4 Gleason secondary pattern: 3 Gleason score: 7 Histologic grading system: 5 grade system Number of biopsy cores examined: 12 Number of biopsy cores positive: 11 Location of positive needle core biopsies: Both sides   03/08/2021 Initial Diagnosis   Malignant neoplasm of prostate ([name])       ICD-10-CM   1. Malignant neoplasm of prostate ([name])  C61       COMPLEX SIMULATION:  The patient presented today for evaluation for possible prostate seed implant. He was brought to the radiation planning suite and placed supine on the CT couch. A 3-dimensional image study set was obtained in upload to the planning computer. There, on each axial slice, I contoured the prostate gland. Then, using three-dimensional radiation planning tools I reconstructed the prostate in view of the structures from the transperineal needle pathway to assess for possible pubic arch interference. In doing so, I did not appreciate any pubic arch interference. Also, the patient's prostate volume was estimated based on the drawn structure. The volume was 45 cc.  Given the pubic arch appearance and prostate volume, patient remains a good candidate to  proceed with prostate seed implant. Today, he freely provided informed written consent to proceed.    PLAN: The patient will undergo prostate seed implant.   ________________________________  Sheral Apley. Tammi Klippel, M.D.

## 2021-04-28 ENCOUNTER — Ambulatory Visit (HOSPITAL_COMMUNITY)
Admission: RE | Admit: 2021-04-28 | Discharge: 2021-04-28 | Disposition: A | Discharge status: Home / Self Care | Payer: Medicare Other | Source: Ambulatory Visit | Attending: Urology | Admitting: Urology

## 2021-04-28 ENCOUNTER — Encounter (HOSPITAL_COMMUNITY)
Admission: RE | Admit: 2021-04-28 | Discharge: 2021-04-28 | Disposition: A | Discharge status: Home / Self Care | Payer: Medicare Other | Source: Ambulatory Visit | Attending: Urology | Admitting: Urology

## 2021-04-28 ENCOUNTER — Ambulatory Visit
Admission: RE | Admit: 2021-04-28 | Discharge: 2021-04-28 | Disposition: A | Discharge status: Home / Self Care | Payer: Medicare Other | Source: Ambulatory Visit | Attending: Radiation Oncology | Admitting: Radiation Oncology

## 2021-04-28 ENCOUNTER — Encounter: Payer: Self-pay | Admitting: Urology

## 2021-04-28 ENCOUNTER — Other Ambulatory Visit: Payer: Self-pay

## 2021-04-28 ENCOUNTER — Ambulatory Visit
Admission: RE | Admit: 2021-04-28 | Discharge: 2021-04-28 | Disposition: A | Discharge status: Home / Self Care | Payer: Medicare Other | Source: Ambulatory Visit | Attending: Urology | Admitting: Urology

## 2021-04-28 DIAGNOSIS — C61 Malignant neoplasm of prostate: Secondary | ICD-10-CM | POA: Insufficient documentation

## 2021-04-28 NOTE — Progress Notes (Signed)
Patient states doing well. No concerning symptoms reported at this time.   No urinary management medications at this time. No urology follow-up scheduled at this time-per patient.   Meaningful use complete.

## 2021-05-19 ENCOUNTER — Other Ambulatory Visit: Payer: Self-pay

## 2021-05-19 ENCOUNTER — Encounter (HOSPITAL_COMMUNITY)
Admission: RE | Admit: 2021-05-19 | Discharge: 2021-05-19 | Disposition: A | Discharge status: Home / Self Care | Payer: Medicare Other | Source: Ambulatory Visit | Attending: Urology | Admitting: Urology

## 2021-05-19 DIAGNOSIS — Z01812 Encounter for preprocedural laboratory examination: Secondary | ICD-10-CM | POA: Diagnosis present

## 2021-05-19 LAB — PROTIME-INR
INR: 0.9 (ref 0.8–1.2)
Prothrombin Time: 12.4 seconds (ref 11.4–15.2)

## 2021-05-19 LAB — COMPREHENSIVE METABOLIC PANEL
ALT: 28 U/L (ref 0–44)
AST: 20 U/L (ref 15–41)
Albumin: 4.2 g/dL (ref 3.5–5.0)
Alkaline Phosphatase: 74 U/L (ref 38–126)
Anion gap: 8 (ref 5–15)
BUN: 19 mg/dL (ref 8–23)
CO2: 27 mmol/L (ref 22–32)
Calcium: 9 mg/dL (ref 8.9–10.3)
Chloride: 104 mmol/L (ref 98–111)
Creatinine, Ser: 1.1 mg/dL (ref 0.61–1.24)
GFR, Estimated: >60 mL/min (ref >60)
Glucose, Bld: 146 mg/dL — ABNORMAL HIGH (ref 70–99)
Potassium: 3.8 mmol/L (ref 3.5–5.1)
Sodium: 139 mmol/L (ref 135–145)
Total Bilirubin: 0.6 mg/dL (ref 0.3–1.2)
Total Protein: 7 g/dL (ref 6.5–8.1)

## 2021-05-19 LAB — CBC
HCT: 51.2 % (ref 39.0–52.0)
Hemoglobin: 16.7 g/dL (ref 13.0–17.0)
MCH: 30.5 pg (ref 26.0–34.0)
MCHC: 32.6 g/dL (ref 30.0–36.0)
MCV: 93.4 fL (ref 80.0–100.0)
Platelets: 228 10*3/uL (ref 150–400)
RBC: 5.48 MIL/uL (ref 4.22–5.81)
RDW: 12.7 % (ref 11.5–15.5)
WBC: 14.1 10*3/uL — ABNORMAL HIGH (ref 4.0–10.5)
nRBC: 0 % (ref 0.0–0.2)

## 2021-05-19 LAB — APTT: aPTT: 32 seconds (ref 24–36)

## 2021-05-23 ENCOUNTER — Encounter (HOSPITAL_BASED_OUTPATIENT_CLINIC_OR_DEPARTMENT_OTHER): Payer: Self-pay | Admitting: Urology

## 2021-05-23 ENCOUNTER — Other Ambulatory Visit: Payer: Self-pay

## 2021-05-23 NOTE — Progress Notes (Signed)
Spoke w/ via phone for pre-op interview---pt Lab needs dos---- none              Lab results------ekg 04-28-2021 chart/epic, chestxray 04-28-2021 chart/epic, labs cbc cmp pt ptt done 05-19-2021 epic COVID test -----rapid covid test 800 am 05-29-2021 due to pt coming back from [place] 05-27-2021  Arrive at -------800 am NPO after MN NO Solid Food.  Clear liquids from MN until---900 am Med rec completed Medications to take morning of surgery -----none Diabetic medication -----n/a Patient instructed no nail polish to be worn day of surgery Patient instructed to bring photo id and insurance card day of surgery Patient aware to have Driver (ride ) / caregiver    for 24 hours after surgery wife Opal Sidles will stay Patient Special Instructions -----fleets enema am of surgery Pre-Op special Istructions -----none Patient verbalized understanding of instructions that were given at this phone interview. Patient denies shortness of breath, chest pain, fever, cough at this phone interview.

## 2021-05-26 ENCOUNTER — Telehealth: Payer: Self-pay | Admitting: *Deleted

## 2021-05-26 NOTE — Telephone Encounter (Signed)
CALLED PATIENT TO REMIND OF PROCEDURE FOR 05-29-21, LVM FOR A RETURN CALL

## 2021-05-28 NOTE — Progress Notes (Signed)
  Radiation Oncology         [phone]) [phone] ________________________________  Name: Andrew Hudson MRN: [id number]  Date: 05/29/2021  DOB: 1955-09-28       Prostate Seed Implant  CX:[id number], Andrew Hudson., PA-C  No ref. provider found  DIAGNOSIS: 65 y.o. 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  Oncology History  Malignant neoplasm of prostate ([place])  01/03/2021 Cancer Staging   Staging form: Prostate, AJCC 8th Edition - Clinical stage from 01/03/2021: Stage IIIA (cT2c, cN0, cM0, PSA: 36.2, Grade Group: 3) - Signed by Andrew Caldron, PA-C on 03/08/2021 Histopathologic type: Adenocarcinoma, NOS Stage prefix: Initial diagnosis Prostate specific antigen (PSA) range: 20 or greater Gleason primary pattern: 4 Gleason secondary pattern: 3 Gleason score: 7 Histologic grading system: 5 grade system Number of biopsy cores examined: 12 Number of biopsy cores positive: 11 Location of positive needle core biopsies: Both sides    03/08/2021 Initial Diagnosis   Malignant neoplasm of prostate ([place])       ICD-10-CM   1. Malignant neoplasm of prostate ([place])  C61 Resp Panel by RT-PCR (Flu A&B, Covid) Nasopharyngeal Swab    Resp Panel by RT-PCR (Flu A&B, Covid) Nasopharyngeal Swab    Discharge patient      PROCEDURE: Insertion of radioactive I-125 seeds into the prostate gland.  RADIATION DOSE: 110 Gy, boost therapy.  TECHNIQUE: Andrew Hudson was brought to the operating room with the urologist. He was placed in the dorsolithotomy position. He was catheterized and a rectal tube was inserted. The perineum was shaved, prepped and draped. The ultrasound probe was then introduced into the rectum to see the prostate gland.  TREATMENT DEVICE: A needle grid was attached to the ultrasound probe stand and anchor needles were placed.  3D PLANNING: The prostate was imaged in 3D using a sagittal sweep of the prostate probe. These images were transferred to  the planning computer. There, the prostate, urethra and rectum were defined on each axial reconstructed image. Then, the software created an optimized 3D plan and a few seed positions were adjusted. The quality of the plan was reviewed using [facility] information for the target and the following two organs at risk:  Urethra and Rectum.  Then the accepted plan was printed and handed off to the radiation therapist.  Under my supervision, the custom loading of the seeds and spacers was carried out and loaded into sealed vicryl sleeves.  These pre-loaded needles were then placed into the needle holder.Marland Kitchen  PROSTATE VOLUME STUDY:  Using transrectal ultrasound the volume of the prostate was verified to be 49 cc.  SPECIAL TREATMENT PROCEDURE/SUPERVISION AND HANDLING: The pre-loaded needles were then delivered under sagittal guidance. A total of 19 needles were used to deposit 69 seeds in the prostate gland. The individual seed activity was 0.382 mCi.  SpaceOAR:  Yes, good especially at apex  COMPLEX SIMULATION: At the end of the procedure, an anterior radiograph of the pelvis was obtained to document seed positioning and count. Cystoscopy was performed to check the urethra and bladder.  MICRODOSIMETRY: At the end of the procedure, the patient was emitting 0.15 mR/hr at 1 meter. Accordingly, he was considered safe for hospital discharge.  PLAN: The patient will return to the radiation oncology clinic for post implant CT dosimetry and IMRT planning in tow to three weeks.   ________________________________  Andrew Hudson Andrew Hudson, M.D.

## 2021-05-29 ENCOUNTER — Ambulatory Visit (HOSPITAL_BASED_OUTPATIENT_CLINIC_OR_DEPARTMENT_OTHER): Payer: Medicare Other | Admitting: Certified Registered"

## 2021-05-29 ENCOUNTER — Encounter (HOSPITAL_BASED_OUTPATIENT_CLINIC_OR_DEPARTMENT_OTHER): Payer: Self-pay | Admitting: Urology

## 2021-05-29 ENCOUNTER — Encounter (HOSPITAL_BASED_OUTPATIENT_CLINIC_OR_DEPARTMENT_OTHER)
Admission: RE | Disposition: A | Discharge status: Home / Self Care | Payer: Self-pay | Source: Ambulatory Visit | Attending: Urology

## 2021-05-29 ENCOUNTER — Ambulatory Visit (HOSPITAL_BASED_OUTPATIENT_CLINIC_OR_DEPARTMENT_OTHER)
Admission: RE | Admit: 2021-05-29 | Discharge: 2021-05-29 | Disposition: A | Discharge status: Home / Self Care | Payer: Medicare Other | Source: Ambulatory Visit | Attending: Urology | Admitting: Urology

## 2021-05-29 ENCOUNTER — Other Ambulatory Visit: Payer: Self-pay

## 2021-05-29 ENCOUNTER — Ambulatory Visit (HOSPITAL_COMMUNITY): Payer: Medicare Other

## 2021-05-29 DIAGNOSIS — Z20822 Contact with and (suspected) exposure to covid-19: Secondary | ICD-10-CM | POA: Diagnosis not present

## 2021-05-29 DIAGNOSIS — C61 Malignant neoplasm of prostate: Secondary | ICD-10-CM | POA: Insufficient documentation

## 2021-05-29 DIAGNOSIS — Z87891 Personal history of nicotine dependence: Secondary | ICD-10-CM | POA: Diagnosis not present

## 2021-05-29 HISTORY — DX: Malignant (primary) neoplasm, unspecified: C80.1

## 2021-05-29 HISTORY — PX: SPACE OAR INSTILLATION: SHX6769

## 2021-05-29 HISTORY — DX: Renal agenesis, unilateral: Q60.0

## 2021-05-29 HISTORY — PX: RADIOACTIVE SEED IMPLANT: SHX5150

## 2021-05-29 HISTORY — PX: CYSTOSCOPY: SHX5120

## 2021-05-29 HISTORY — DX: Concussion with loss of consciousness status unknown, initial encounter: S06.0XAA

## 2021-05-29 LAB — RESP PANEL BY RT-PCR (FLU A&B, COVID) ARPGX2
Influenza A by PCR: NEGATIVE
Influenza B by PCR: NEGATIVE
SARS Coronavirus 2 by RT PCR: NEGATIVE

## 2021-05-29 SURGERY — INSERTION, RADIATION SOURCE, PROSTATE
Anesthesia: General | Site: Prostate

## 2021-05-29 MED ORDER — CIPROFLOXACIN IN D5W 400 MG/200ML IV SOLN
INTRAVENOUS | Status: AC
  Filled 2021-05-29: qty 200

## 2021-05-29 MED ORDER — FENTANYL CITRATE (PF) 100 MCG/2ML IJ SOLN
INTRAMUSCULAR | Status: AC
  Filled 2021-05-29: qty 2

## 2021-05-29 MED ORDER — MIDAZOLAM HCL 2 MG/2ML IJ SOLN
INTRAMUSCULAR | Status: AC
  Filled 2021-05-29: qty 2

## 2021-05-29 MED ORDER — SODIUM CHLORIDE (PF) 0.9 % IJ SOLN
INTRAMUSCULAR | Status: DC | PRN
  Administered 2021-05-29: 10 mL

## 2021-05-29 MED ORDER — ONDANSETRON HCL 4 MG/2ML IJ SOLN
INTRAMUSCULAR | Status: DC | PRN
  Administered 2021-05-29: 4 mg via INTRAVENOUS

## 2021-05-29 MED ORDER — DEXAMETHASONE SODIUM PHOSPHATE 10 MG/ML IJ SOLN
INTRAMUSCULAR | Status: DC | PRN
  Administered 2021-05-29: 5 mg via INTRAVENOUS

## 2021-05-29 MED ORDER — FENTANYL CITRATE (PF) 100 MCG/2ML IJ SOLN: 25.0000 ug | INTRAMUSCULAR | Status: DC | PRN

## 2021-05-29 MED ORDER — PROPOFOL 10 MG/ML IV BOLUS
INTRAVENOUS | Status: DC | PRN
  Administered 2021-05-29: 180 mg via INTRAVENOUS
  Administered 2021-05-29: 20 mg via INTRAVENOUS

## 2021-05-29 MED ORDER — LIDOCAINE 2% (20 MG/ML) 5 ML SYRINGE
INTRAMUSCULAR | Status: DC | PRN
  Administered 2021-05-29: 80 mg via INTRAVENOUS

## 2021-05-29 MED ORDER — FENTANYL CITRATE (PF) 100 MCG/2ML IJ SOLN
INTRAMUSCULAR | Status: DC | PRN
  Administered 2021-05-29: 50 ug via INTRAVENOUS
  Administered 2021-05-29 (×2): 25 ug via INTRAVENOUS

## 2021-05-29 MED ORDER — PHENYLEPHRINE 40 MCG/ML (10ML) SYRINGE FOR IV PUSH (FOR BLOOD PRESSURE SUPPORT)
PREFILLED_SYRINGE | INTRAVENOUS | Status: DC | PRN
  Administered 2021-05-29: 40 ug via INTRAVENOUS

## 2021-05-29 MED ORDER — IOHEXOL 300 MG/ML  SOLN
INTRAMUSCULAR | Status: DC | PRN
  Administered 2021-05-29: 7 mL

## 2021-05-29 MED ORDER — SODIUM CHLORIDE 0.9 % IV SOLN: INTRAVENOUS | Status: DC

## 2021-05-29 MED ORDER — MIDAZOLAM HCL 5 MG/5ML IJ SOLN
INTRAMUSCULAR | Status: DC | PRN
  Administered 2021-05-29: 2 mg via INTRAVENOUS

## 2021-05-29 MED ORDER — DOCUSATE SODIUM 100 MG PO CAPS: 100.0000 mg | ORAL_CAPSULE | Freq: Every day | ORAL | 0 refills | Status: DC | PRN

## 2021-05-29 MED ORDER — PROPOFOL 10 MG/ML IV BOLUS
INTRAVENOUS | Status: AC
  Filled 2021-05-29: qty 20

## 2021-05-29 MED ORDER — FLEET ENEMA 7-19 GM/118ML RE ENEM
1.0000 | ENEMA | Freq: Once | RECTAL | Status: DC
Start: 2021-05-30 — End: 2021-05-29

## 2021-05-29 MED ORDER — STERILE WATER FOR IRRIGATION IR SOLN
Status: DC | PRN
  Administered 2021-05-29: 3 mL

## 2021-05-29 MED ORDER — LIDOCAINE 2% (20 MG/ML) 5 ML SYRINGE
INTRAMUSCULAR | Status: AC
  Filled 2021-05-29: qty 5

## 2021-05-29 MED ORDER — KETOROLAC TROMETHAMINE 30 MG/ML IJ SOLN
INTRAMUSCULAR | Status: DC | PRN
  Administered 2021-05-29: 30 mg via INTRAVENOUS

## 2021-05-29 MED ORDER — PROMETHAZINE HCL 25 MG/ML IJ SOLN: 6.2500 mg | INTRAMUSCULAR | Status: DC | PRN

## 2021-05-29 MED ORDER — CIPROFLOXACIN IN D5W 400 MG/200ML IV SOLN
400.0000 mg | INTRAVENOUS | Status: AC
  Administered 2021-05-29: 400 mg via INTRAVENOUS

## 2021-05-29 MED ORDER — ACETAMINOPHEN 10 MG/ML IV SOLN: 1000.0000 mg | Freq: Once | INTRAVENOUS | Status: DC | PRN

## 2021-05-29 MED ORDER — SODIUM CHLORIDE 0.9 % IR SOLN
Status: DC | PRN
  Administered 2021-05-29: 200 mL

## 2021-05-29 MED ORDER — OXYCODONE-ACETAMINOPHEN 5-325 MG PO TABS: 1.0000 | ORAL_TABLET | ORAL | 0 refills | Status: DC | PRN

## 2021-05-29 SURGICAL SUPPLY — 40 items
BAG DRN RND TRDRP ANRFLXCHMBR (UROLOGICAL SUPPLIES) ×2
BAG URINE DRAIN 2000ML AR STRL (UROLOGICAL SUPPLIES) ×3 IMPLANT
BLADE CLIPPER SENSICLIP SURGIC (BLADE) ×3 IMPLANT
Bard BrachySourceI-125 Implant Seed ×207 IMPLANT
CATH FOLEY 2WAY SLVR  5CC 16FR (CATHETERS) ×1
CATH FOLEY 2WAY SLVR 5CC 16FR (CATHETERS) ×2 IMPLANT
CATH ROBINSON RED A/P 16FR (CATHETERS) IMPLANT
CATH ROBINSON RED A/P 20FR (CATHETERS) ×3 IMPLANT
CLOTH BEACON ORANGE TIMEOUT ST (SAFETY) ×3 IMPLANT
COVER BACK TABLE 60X90IN (DRAPES) ×3 IMPLANT
COVER MAYO STAND STRL (DRAPES) ×3 IMPLANT
DRSG TEGADERM 4X4.75 (GAUZE/BANDAGES/DRESSINGS) ×3 IMPLANT
DRSG TEGADERM 8X12 (GAUZE/BANDAGES/DRESSINGS) ×3 IMPLANT
GAUZE SPONGE 4X4 12PLY STRL LF (GAUZE/BANDAGES/DRESSINGS) ×3 IMPLANT
GEL ULTRASOUND 20GR AQUASONIC (MISCELLANEOUS) ×3 IMPLANT
GLOVE SURG ENC MOIS LTX SZ6 (GLOVE) IMPLANT
GLOVE SURG ENC MOIS LTX SZ6.5 (GLOVE) IMPLANT
GLOVE SURG ENC MOIS LTX SZ7 (GLOVE) ×3 IMPLANT
GLOVE SURG ENC MOIS LTX SZ8 (GLOVE) IMPLANT
GLOVE SURG ORTHO LTX SZ8.5 (GLOVE) IMPLANT
GLOVE SURG UNDER POLY LF SZ6.5 (GLOVE) IMPLANT
GOWN STRL REUS W/TWL LRG LVL3 (GOWN DISPOSABLE) ×3 IMPLANT
GRID BRACH TEMP 18GA 2.8X3X.75 (MISCELLANEOUS) ×3 IMPLANT
HOLDER FOLEY CATH W/STRAP (MISCELLANEOUS) IMPLANT
IMPL SPACEOAR VUE SYSTEM (Spacer) ×2 IMPLANT
IMPLANT SPACEOAR VUE SYSTEM (Spacer) ×3 IMPLANT
IV NS 1000ML (IV SOLUTION) ×3
IV NS 1000ML BAXH (IV SOLUTION) ×2 IMPLANT
KIT TURNOVER CYSTO (KITS) ×3 IMPLANT
NEEDLE BRACHY 18G 5PK (NEEDLE) ×12 IMPLANT
NEEDLE BRACHY 18G SINGLE (NEEDLE) IMPLANT
NEEDLE PK MORGANSTERN STABILIZ (NEEDLE) ×3 IMPLANT
PACK CYSTO (CUSTOM PROCEDURE TRAY) ×3 IMPLANT
SHEATH ULTRASOUND LF (SHEATH) IMPLANT
SHEATH ULTRASOUND LTX NONSTRL (SHEATH) ×3 IMPLANT
SUT BONE WAX W31G (SUTURE) IMPLANT
SYR 10ML LL (SYRINGE) ×6 IMPLANT
TOWEL OR 17X26 10 PK STRL BLUE (TOWEL DISPOSABLE) ×3 IMPLANT
UNDERPAD 30X36 HEAVY ABSORB (UNDERPADS AND DIAPERS) ×6 IMPLANT
WATER STERILE IRR 500ML POUR (IV SOLUTION) ×3 IMPLANT

## 2021-05-29 NOTE — Op Note (Signed)
PATIENT:  Andrew Hudson  PRE-OPERATIVE DIAGNOSIS:  Adenocarcinoma of the prostate  POST-OPERATIVE DIAGNOSIS:  Same  PROCEDURE:  1. I-125 radioactive seed implantation 2. Cystoscopy  3. Placement of SpaceOAR  SURGEON:  Rexene Alberts, MD  Radiation oncologist: Tyler Pita, MD  ANESTHESIA:  General  EBL:  Minimal  DRAINS: None  INDICATION: Andrew Hudson  Description of procedure: After informed consent the patient was brought to the major OR, placed on the table and administered general anesthesia. He was then moved to the modified lithotomy position with his perineum perpendicular to the floor. His perineum and genitalia were then sterilely prepped. An official timeout was then performed. A 16 French Foley catheter was then placed in the bladder and filled with dilute contrast, a rectal tube was placed in the rectum and the transrectal ultrasound probe was placed in the rectum and affixed to the stand. He was then sterilely draped.  Real time ultrasonography was used along with the seed planning software. This was used to develop the seed plan including the number of needles as well as number of seeds required for complete and adequate coverage. Real-time ultrasonography was then used along with the previously developed plan and the Nucletron device to implant a total of 69 seeds using 19 needles. This proceeded without difficulty or complication.   I then proceeded with placement of SpaceOAR by introducing a needle with the bevel angled inferiorly approximately 2 cm superior to the anus. This was angled downward and under direct ultrasound was placed within the space between the prostatic capsule and rectum. This was confirmed with a small amount of sterile saline injected and this was performed under direct ultrasound. I then attached the SpaceOAR to the needle and injected this in the space between the prostate and rectum with good placement noted.  A Foley catheter was then removed as  well as the transrectal ultrasound probe and rectal probe. Flexible cystoscopy was then performed using the 16 French flexible scope which revealed a normal urethra throughout its length down to the sphincter which appeared intact. The prostatic urethra revealed bilobar hypertrophy but no evidence of obstruction, seeds, spacers or lesions. The bladder was then entered and fully and systematically inspected. The ureteral orifices were noted to be of normal configuration and position. The mucosa revealed no evidence of tumors. There were also no stones identified within the bladder. I noted no seeds or spacers on the floor of the bladder and retroflexion of the scope revealed no seeds protruding from the base of the prostate.  The cystoscope was then removed and the patient was awakened and taken to recovery room in stable and satisfactory condition. He tolerated procedure well and there were no intraoperative complications.  [facility]  Pager: [phone]

## 2021-05-29 NOTE — Anesthesia Procedure Notes (Signed)
Procedure Name: LMA Insertion Date/Time: 05/29/2021 12:17 PM Performed by: Gwyndolyn Saxon, CRNA Pre-anesthesia Checklist: Patient identified, Emergency Drugs available, Suction available and Patient being monitored Patient Re-evaluated:Patient Re-evaluated prior to induction Oxygen Delivery Method: Circle system utilized Preoxygenation: Pre-oxygenation with 100% oxygen Induction Type: IV induction Ventilation: Mask ventilation without difficulty LMA: LMA inserted LMA Size: 4.0 Number of attempts: 1 Placement Confirmation: positive ETCO2 and breath sounds checked- equal and bilateral Tube secured with: Tape Dental Injury: Teeth and Oropharynx as per pre-operative assessment  Comments: Pt with difficulty opening jaw at times; no diagnosis of TMJ. Pt states "sometimes I have to move my jaw around with my hand before it will open."

## 2021-05-29 NOTE — Transfer of Care (Signed)
Immediate Anesthesia Transfer of Care Note  Patient: Andrew Hudson  Procedure(s) Performed: RADIOACTIVE SEED IMPLANT/BRACHYTHERAPY IMPLANT (Prostate) SPACE OAR INSTILLATION (Prostate) CYSTOSCOPY FLEXIBLE (Bladder)  Patient Location: PACU  Anesthesia Type:General  Level of Consciousness: drowsy and patient cooperative  Airway & Oxygen Therapy: Patient Spontanous Breathing and Patient connected to face mask oxygen  Post-op Assessment: Report given to RN and Post -op Vital signs reviewed and stable  Post vital signs: Reviewed and stable  Last Vitals:  Vitals Value Taken Time  BP    Temp    Pulse    Resp    SpO2      Last Pain:  Vitals:   05/29/21 1029  TempSrc: Oral  PainSc: 0-No pain         Complications: No notable events documented.

## 2021-05-29 NOTE — Discharge Instructions (Addendum)
Activity:  You are encouraged to ambulate frequently (about every hour during waking hours) to help prevent blood clots from forming in your legs or lungs.    Diet: You should advance your diet as instructed by your physician.  It will be normal to have some bloating, nausea, and abdominal discomfort intermittently.  Prescriptions:  You will be provided a prescription for pain medication to take as needed.  If your pain is not severe enough to require the prescription pain medication, you may take extra strength Tylenol instead which will have less side effects.  You should also take a prescribed stool softener to avoid straining with bowel movements as the prescription pain medication may constipate you.  What to call us about: You should call the office [phone]) if you develop fever > 101 or develop persistent vomiting. Activity:  You are encouraged to ambulate frequently (about every hour during waking hours) to help prevent blood clots from forming in your legs or lungs.    Post Anesthesia Home Care Instructions  Activity: Get plenty of rest for the remainder of the day. A responsible adult should stay with you for 24 hours following the procedure.  For the next 24 hours, DO NOT: -Drive a car -Paediatric nurse -Drink alcoholic beverages -Take any medication unless instructed by your physician -Make any legal decisions or sign important papers.  Meals: Start with liquid foods such as gelatin or soup. Progress to regular foods as tolerated. Avoid greasy, spicy, heavy foods. If nausea and/or vomiting occur, drink only clear liquids until the nausea and/or vomiting subsides. Call your physician if vomiting continues.  Special Instructions/Symptoms: Your throat may feel dry or sore from the anesthesia or the breathing tube placed in your throat during surgery. If this causes discomfort, gargle with warm salt water. The discomfort should disappear within 24 hours.  If you had a  scopolamine patch placed behind your ear for the management of post- operative nausea and/or vomiting:  1. The medication in the patch is effective for 72 hours, after which it should be removed.  Wrap patch in a tissue and discard in the trash. Wash hands thoroughly with soap and water. 2. You may remove the patch earlier than 72 hours if you experience unpleasant side effects which may include dry mouth, dizziness or visual disturbances. 3. Avoid touching the patch. Wash your hands with soap and water after contact with the patch.

## 2021-05-29 NOTE — H&P (Signed)
Office Visit Report     03/28/2021   --------------------------------------------------------------------------------   Andrew Hudson  MRN: [id number]  DOB: 18-Aug-1955, 65 year old Male  SSN:    PRIMARY CARE:  [facility], [place]  REFERRING:  [name], MD  PROVIDER:  Rexene Alberts, M.D.  LOCATION:  [facility] [phone]     --------------------------------------------------------------------------------   CC/HPI: Andrew Hudson is a 65 year old male seen to discuss his new diagnosis of prostate cancer and definitive treatment options. He has elected to proceed with brachy boost and TURBT. He has declined adjuvant ADT.   Patient underwent prostate biopsy on 01/03/2021 for an elevated PSA of 36 ng/mL. Biopsy revealed GS 4+3, adenocarcinoma of the prostate with 11/12 cores positive (one core did not make it through tissue processing) (80-95%), TRUS volume of 45 cm3. Denies new or worsening bone or back pain. Good appetite and stable weight.   Family history: Denies  Imaging studies: PET scan 02/03/2021 with intense multifocal activity within the prostate gland, no metastatic lymphadenopathy in the pelvis or retroperitoneum, no visceral metastasis, low radiotracer activity in the posterior right iliac bone and left third rib.  -MRI pelvis 03/07/2021 with ill-defined low T1 signal in the posterior right iliac bone with minimal enhancement.  -Bone scan 03/22/2020 with no evidence of metastatic bone disease   PMH: None  PSH: None   TNM stage: cT2cN0M0  PSA: 36 on 12/13/2020  Gleason score: 4+3 = 7  Biopsy: 01/03/2021  Prostate volume: 45cm^3  PSAD: 0.84   Nomogram  CSS (15 year): 73%  PFS (5 year, 10 year): 9%, 5%  EPE: 97%  LNI: 75%  SVI: 80%   IPSS: 2, QOL 0  SHIM: He did not complete     ALLERGIES: None   MEDICATIONS: None   GU PSH: Prostate Needle Biopsy - 01/03/2021     NON-GU PSH: Surgical Pathology, Gross And Microscopic Examination For Prostate Needle  - 01/03/2021     GU PMH: Prostate Cancer - 02/09/2021 Elevated PSA - 01/03/2021, - 12/13/2020 Prostate nodule w/o LUTS - 01/03/2021, - 12/13/2020    NON-GU PMH: None   FAMILY HISTORY: None   SOCIAL HISTORY: None   REVIEW OF SYSTEMS:    GU Review Male:   Patient denies frequent urination, hard to postpone urination, burning/ pain with urination, get up at night to urinate, leakage of urine, stream starts and stops, trouble starting your stream, have to strain to urinate , erection problems, and penile pain.  Gastrointestinal (Upper):   Patient denies vomiting, nausea, and indigestion/ heartburn.  Gastrointestinal (Lower):   Patient denies diarrhea and constipation.  Constitutional:   Patient denies fever, night sweats, weight loss, and fatigue.  Skin:   Patient denies skin rash/ lesion and itching.  Eyes:   Patient denies blurred vision and double vision.  Ears/ Nose/ Throat:   Patient denies sore throat and sinus problems.  Hematologic/Lymphatic:   Patient denies swollen glands and easy bruising.  Cardiovascular:   Patient denies leg swelling and chest pains.  Respiratory:   Patient denies cough and shortness of breath.  Endocrine:   Patient denies excessive thirst.  Musculoskeletal:   Patient denies back pain and joint pain.  Neurological:   Patient denies headaches and dizziness.  Psychologic:   Patient denies depression and anxiety.   VITAL SIGNS: None   MULTI-SYSTEM PHYSICAL EXAMINATION:    Constitutional: Well-nourished. No physical deformities. Normally developed. Good grooming.  Respiratory: No labored breathing, no use of accessory muscles.  Cardiovascular: Normal temperature, normal extremity pulses, no swelling, no varicosities.  Gastrointestinal: No mass, no tenderness, no rigidity, non obese abdomen.     Complexity of Data:  Source Of History:  Patient, Medical Record Summary  Lab Test Review:   PSA  Records Review:   Pathology Reports, Previous Doctor Records   Urine Test Review:   Urinalysis  X-Ray Review: MRI Pelvis: Reviewed Films. Reviewed Report. Discussed With Patient.  Bone Scan: Reviewed Films. Reviewed Report. Discussed With Patient.     12/13/20  PSA  Total PSA 36.20 ng/mL    PROCEDURES: None   ASSESSMENT:      ICD-10 Details  1 GU:   Prostate Cancer - C61    PLAN:           Document Letter(s):  Created for Patient: Clinical Summary         Notes:    1. Newly diagnosed cT2cN0M0 GS 4+3, adenocarcinoma of the prostate with 11/12 cores positive (one core did not make it through tissue processing) (80-95%), TRUS volume of 45 cm3, prebiopsy PSA = 36 ng/mL  -Reviewed pathology report in detail again. Explained that the patient has high risk disease based on his pathology report and elevated PSA (36). We discussed that his PSA being over 20 puts him in the high risk category despite having Gleason score 4+3 = 7.  -Reviewed MSK nomogram as above  -Reviewed imaging as above with no evidence of metastatic disease.  -He has declined ADT. He understands the risks of recurrence and the NCCN guidelines that recommend ADT.  -He has elected to proceed with brachytherapy boost and EBRT. Surgery letter sent for brachytherapy. Discussed risk and benefits. We will plan for SpaceOAR. Discussed SpaceOAR.    Brachytherapy/space OAR consent- The patient was counseled about the natural history of prostate cancer and the standard treatment options that are available for prostate cancer. It was explained to him how his age and life expectancy, clinical stage, Gleason score, and PSA affect his prognosis, the decision to proceed with additional staging studies, as well as how that information influences recommended treatment strategies. We discussed the roles for active surveillance, radiation therapy, surgical therapy, androgen deprivation, as well as ablative therapy options for the treatment of prostate cancer as appropriate to his individual cancer  situation. We discussed the risks and benefits of these options with regard to their impact on cancer control and also in terms of potential adverse events, complications, and impact on quality of life particularly related to urinary and sexual function. The patient was encouraged to ask questions throughout the discussion today and all questions were answered to his stated satisfaction. In addition, the patient was provided with and/or directed to appropriate resources and literature for further education about prostate cancer and treatment options.   The patient has decided to proceed with brachytherapy and SpaceOAR placement as primary treatment of his intermediate risk prostate cancer. The risks, benefits and alternatives of the aforementioned procedures was discussed in detail. Risks include, bur are not limited to worsening LUTS, erectile dysfunction, rectal irritation, urethral stricture formation, fistula formation, cancer recurrence, MI, CVA, PE, DVT and the inherent risk of general anesthesia. He voices understanding and wishes to proceed.    CC: Bing Matter, PA  CC: Tyler Pita, MD   Urology Preoperative H&P   Chief Complaint: Prostate cancer  History of Present Illness: Andrew Hudson is a 65 y.o. male with prostate cancer here for brachytherapy.    Past Medical History:  Diagnosis Date  Cancer [facility])    Concussion    in [employer] no residual   Solitary kidney, congenital    pt born with 1 kidney, not sure which side kidney is on    Past Surgical History:  Procedure Laterality Date   basal cell carcinoma areas removed     last done 2016 mohs procedure on lip   TONSILLECTOMY     age 54    Allergies: No Known Allergies  Family History  Problem Relation Age of Onset   Breast cancer Sister     Social History:  reports that he quit smoking about 6 years ago. His smoking use included cigarettes. He has a 30.00 pack-year smoking history. He has never used  smokeless tobacco. He reports current alcohol use. He reports that he does not currently use drugs after having used the following drugs: Marijuana.  ROS: A complete review of systems was performed.  All systems are negative except for pertinent findings as noted.  Physical Exam:  Vital signs in last 24 hours:   Constitutional:  Alert and oriented, No acute distress Cardiovascular: Regular rate and rhythm Respiratory: Normal respiratory effort, Lungs clear bilaterally GI: Abdomen is soft, nontender, nondistended, no abdominal masses GU: No CVA tenderness Lymphatic: No lymphadenopathy Neurologic: Grossly intact, no focal deficits Psychiatric: Normal mood and affect  Laboratory Data:  No results for input(s): WBC, HGB, HCT, PLT in the last 72 hours.  No results for input(s): NA, K, CL, GLUCOSE, BUN, CALCIUM, CREATININE in the last 72 hours.  Invalid input(s): CO3   No results found for this or any previous visit (from the past 24 hour(s)). No results found for this or any previous visit (from the past 240 hour(s)).  Renal Function: No results for input(s): CREATININE in the last 168 hours. Estimated Creatinine Clearance: 64.8 mL/min (by C-G formula based on SCr of 1.1 mg/dL).  Radiologic Imaging: No results found.  I independently reviewed the above imaging studies.  Assessment and Plan Donavan Kerlin is a 65 y.o. male with prostate cancer here for brachytherapy.    Brachytherapy/space OAR consent- The patient was counseled about the natural history of prostate cancer and the standard treatment options that are available for prostate cancer. It was explained to him how his age and life expectancy, clinical stage, Gleason score, and PSA affect his prognosis, the decision to proceed with additional staging studies, as well as how that information influences recommended treatment strategies. We discussed the roles for active surveillance, radiation therapy, surgical therapy, androgen  deprivation, as well as ablative therapy options for the treatment of prostate cancer as appropriate to his individual cancer situation. We discussed the risks and benefits of these options with regard to their impact on cancer control and also in terms of potential adverse events, complications, and impact on quality of life particularly related to urinary and sexual function. The patient was encouraged to ask questions throughout the discussion today and all questions were answered to his stated satisfaction. In addition, the patient was provided with and/or directed to appropriate resources and literature for further education about prostate cancer and treatment options.    Matt R. Patrice Audra Bellard MD 05/29/2021, 7:33 AM  [facility] Pager: [phone]): [phone]

## 2021-05-29 NOTE — Anesthesia Postprocedure Evaluation (Signed)
Anesthesia Post Note  Patient: Andrew Hudson  Procedure(s) Performed: RADIOACTIVE SEED IMPLANT/BRACHYTHERAPY IMPLANT (Prostate) SPACE OAR INSTILLATION (Prostate) CYSTOSCOPY FLEXIBLE (Bladder)     Patient location during evaluation: PACU Anesthesia Type: General Level of consciousness: awake and alert Pain management: pain level controlled Vital Signs Assessment: post-procedure vital signs reviewed and stable Respiratory status: spontaneous breathing, nonlabored ventilation, respiratory function stable and patient connected to nasal cannula oxygen Cardiovascular status: blood pressure returned to baseline and stable Postop Assessment: no apparent nausea or vomiting Anesthetic complications: no   No notable events documented.  Last Vitals:  Vitals:   05/29/21 1415 05/29/21 1418  BP:    Pulse: 66 63  Resp: 14 15  Temp:  36.5 C  SpO2: 99% 98%    Last Pain:  Vitals:   05/29/21 1418  TempSrc: Oral  PainSc: 0-No pain                 Belenda Cruise P Debbrah Sampedro

## 2021-05-29 NOTE — Anesthesia Preprocedure Evaluation (Signed)
Anesthesia Evaluation  Patient identified by MRN, date of birth, ID band Patient awake    Reviewed: Allergy & Precautions, NPO status , Patient's Chart, lab work & pertinent test results  Airway Mallampati: II  TM Distance: >3 FB Neck ROM: Full    Dental no notable dental hx.    Pulmonary neg pulmonary ROS, former smoker,    Pulmonary exam normal        Cardiovascular negative cardio ROS   Rhythm:Regular Rate:Normal     Neuro/Psych negative neurological ROS  negative psych ROS   GI/Hepatic negative GI ROS, Neg liver ROS,   Endo/Other  negative endocrine ROS  Renal/GU Renal disease   Prostate Ca    Musculoskeletal negative musculoskeletal ROS (+)   Abdominal Normal abdominal exam  (+)   Peds  Hematology negative hematology ROS (+)   Anesthesia Other Findings   Reproductive/Obstetrics                             Anesthesia Physical Anesthesia Plan  ASA: 2  Anesthesia Plan: General   Post-op Pain Management:    Induction: Intravenous  PONV Risk Score and Plan: 2 and Ondansetron, Dexamethasone, Midazolam and Treatment may vary due to age or medical condition  Airway Management Planned: Mask and LMA  Additional Equipment: None  Intra-op Plan:   Post-operative Plan: Extubation in OR  Informed Consent: I have reviewed the patients History and Physical, chart, labs and discussed the procedure including the risks, benefits and alternatives for the proposed anesthesia with the patient or authorized representative who has indicated his/her understanding and acceptance.     Dental advisory given  Plan Discussed with: CRNA  Anesthesia Plan Comments:         Anesthesia Quick Evaluation

## 2021-05-30 ENCOUNTER — Encounter (HOSPITAL_BASED_OUTPATIENT_CLINIC_OR_DEPARTMENT_OTHER): Payer: Self-pay | Admitting: Urology

## 2021-06-13 ENCOUNTER — Ambulatory Visit: Payer: Medicare Other | Admitting: Radiation Oncology

## 2021-06-13 ENCOUNTER — Ambulatory Visit
Admission: RE | Admit: 2021-06-13 | Discharge: 2021-06-13 | Disposition: A | Discharge status: Home / Self Care | Payer: Medicare Other | Source: Ambulatory Visit | Attending: Radiation Oncology | Admitting: Radiation Oncology

## 2021-06-13 ENCOUNTER — Other Ambulatory Visit: Payer: Self-pay

## 2021-06-13 DIAGNOSIS — C61 Malignant neoplasm of prostate: Secondary | ICD-10-CM | POA: Insufficient documentation

## 2021-06-13 NOTE — Progress Notes (Signed)
  Radiation Oncology         [phone]) [phone] ________________________________  Name: Andrew Hudson MRN: [id number]  Date: 06/13/2021  DOB: 1956/04/28  COMPLEX SIMULATION NOTE  NARRATIVE:  The patient was brought to the [place] today following prostate seed implantation approximately one month ago.  Identity was confirmed.  All relevant records and images related to the planned course of therapy were reviewed.  Then, the patient was set-up supine.  CT images were obtained.  The CT images were loaded into the planning software.  Then the prostate and rectum were contoured.  Treatment planning then occurred.  The implanted iodine 125 seeds were identified by the physics staff for projection of radiation distribution  I have requested : 3D Simulation  I have requested a DVH of the following structures: Prostate and rectum.    ________________________________  Sheral Apley Tammi Klippel, M.D.

## 2021-06-13 NOTE — Progress Notes (Signed)
  Radiation Oncology         [phone]) [phone] ________________________________  Name: Andrew Hudson MRN: [id number]  Date: 06/13/2021  DOB: 09/25/1955  SIMULATION AND TREATMENT PLANNING NOTE    ICD-10-CM   1. Malignant neoplasm of prostate ([place])  C61       DIAGNOSIS:  65 y.o. 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  NARRATIVE:  The patient was brought to the [place].  Identity was confirmed.  All relevant records and images related to the planned course of therapy were reviewed.  The patient freely provided informed written consent to proceed with treatment after reviewing the details related to the planned course of therapy. The consent form was witnessed and verified by the simulation staff.  Then, the patient was set-up in a stable reproducible supine position for radiation therapy.  A vacuum lock pillow device was custom fabricated to position his legs in a reproducible immobilized position.  Then, I performed a urethrogram under sterile conditions to identify the prostatic apex.  CT images were obtained.  Surface markings were placed.  The CT images were loaded into the planning software.  Then the prostate target and avoidance structures including the rectum, bladder, bowel and hips were contoured.  Treatment planning then occurred.  The radiation prescription was entered and confirmed.  A total of one complex treatment devices were fabricated. I have requested : Intensity Modulated Radiotherapy (IMRT) is medically necessary for this case for the following reason:  Rectal sparing.Marland Kitchen  PLAN:  The patient will receive 45 Gy in 25 fractions of 1.8 Gy, to supplement an up-front prostate seed implant boost of 110 Gy to achieve a total nominal dose of 155 Gy.  ________________________________  Sheral Apley Tammi Klippel, M.D.

## 2021-06-15 ENCOUNTER — Encounter: Payer: Self-pay | Admitting: Radiation Oncology

## 2021-06-15 DIAGNOSIS — C61 Malignant neoplasm of prostate: Secondary | ICD-10-CM | POA: Insufficient documentation

## 2021-06-19 DIAGNOSIS — C61 Malignant neoplasm of prostate: Secondary | ICD-10-CM | POA: Diagnosis not present

## 2021-06-22 ENCOUNTER — Other Ambulatory Visit: Payer: Self-pay

## 2021-06-22 ENCOUNTER — Ambulatory Visit
Admission: RE | Admit: 2021-06-22 | Discharge: 2021-06-22 | Disposition: A | Discharge status: Home / Self Care | Payer: Medicare Other | Source: Ambulatory Visit | Attending: Radiation Oncology | Admitting: Radiation Oncology

## 2021-06-22 DIAGNOSIS — C61 Malignant neoplasm of prostate: Secondary | ICD-10-CM | POA: Diagnosis not present

## 2021-06-23 ENCOUNTER — Ambulatory Visit
Admission: RE | Admit: 2021-06-23 | Discharge: 2021-06-23 | Disposition: A | Discharge status: Home / Self Care | Payer: Medicare Other | Source: Ambulatory Visit | Attending: Radiation Oncology | Admitting: Radiation Oncology

## 2021-06-23 ENCOUNTER — Other Ambulatory Visit: Payer: Self-pay

## 2021-06-23 DIAGNOSIS — C61 Malignant neoplasm of prostate: Secondary | ICD-10-CM | POA: Diagnosis not present

## 2021-06-23 NOTE — Progress Notes (Signed)
Pt here for patient teaching. Pt given Radiation and You booklet and skin care instructions.  Reviewed areas of pertinence such as diarrhea, fatigue, hair loss, nausea and vomiting, sexual and fertility changes, skin changes, and urinary and bladder changes. Pt able to give teach back of to pat skin, use unscented/gentle soap, use baby wipes, have Imodium on hand, drink plenty of water, and sitz bath, avoid applying anything to skin within 4 hours of treatment and to use an electric razor if they must shave. Pt verbalizes understanding of information given and will contact nursing with any questions or concerns.     [email or web address]/index      

## 2021-06-26 ENCOUNTER — Other Ambulatory Visit: Payer: Self-pay

## 2021-06-26 ENCOUNTER — Ambulatory Visit
Admission: RE | Admit: 2021-06-26 | Discharge: 2021-06-26 | Disposition: A | Discharge status: Home / Self Care | Payer: Medicare Other | Source: Ambulatory Visit | Attending: Radiation Oncology | Admitting: Radiation Oncology

## 2021-06-26 DIAGNOSIS — C61 Malignant neoplasm of prostate: Secondary | ICD-10-CM | POA: Diagnosis not present

## 2021-06-27 ENCOUNTER — Ambulatory Visit
Admission: RE | Admit: 2021-06-27 | Discharge: 2021-06-27 | Disposition: A | Discharge status: Home / Self Care | Payer: Medicare Other | Source: Ambulatory Visit | Attending: Radiation Oncology | Admitting: Radiation Oncology

## 2021-06-27 ENCOUNTER — Other Ambulatory Visit: Payer: Self-pay

## 2021-06-27 DIAGNOSIS — C61 Malignant neoplasm of prostate: Secondary | ICD-10-CM | POA: Diagnosis not present

## 2021-06-28 ENCOUNTER — Ambulatory Visit
Admission: RE | Admit: 2021-06-28 | Discharge: 2021-06-28 | Disposition: A | Discharge status: Home / Self Care | Payer: Medicare Other | Source: Ambulatory Visit | Attending: Radiation Oncology | Admitting: Radiation Oncology

## 2021-06-28 DIAGNOSIS — C61 Malignant neoplasm of prostate: Secondary | ICD-10-CM | POA: Diagnosis not present

## 2021-06-29 ENCOUNTER — Ambulatory Visit
Admission: RE | Admit: 2021-06-29 | Discharge: 2021-06-29 | Disposition: A | Discharge status: Home / Self Care | Payer: Medicare Other | Source: Ambulatory Visit | Attending: Radiation Oncology | Admitting: Radiation Oncology

## 2021-06-29 ENCOUNTER — Other Ambulatory Visit: Payer: Self-pay

## 2021-06-29 DIAGNOSIS — C61 Malignant neoplasm of prostate: Secondary | ICD-10-CM | POA: Diagnosis not present

## 2021-06-30 ENCOUNTER — Ambulatory Visit
Admission: RE | Admit: 2021-06-30 | Discharge: 2021-06-30 | Disposition: A | Discharge status: Home / Self Care | Payer: Medicare Other | Source: Ambulatory Visit | Attending: Radiation Oncology | Admitting: Radiation Oncology

## 2021-06-30 DIAGNOSIS — C61 Malignant neoplasm of prostate: Secondary | ICD-10-CM | POA: Diagnosis not present

## 2021-07-03 ENCOUNTER — Other Ambulatory Visit: Payer: Self-pay

## 2021-07-03 ENCOUNTER — Ambulatory Visit
Admission: RE | Admit: 2021-07-03 | Discharge: 2021-07-03 | Disposition: A | Discharge status: Home / Self Care | Payer: Medicare Other | Source: Ambulatory Visit | Attending: Radiation Oncology | Admitting: Radiation Oncology

## 2021-07-03 DIAGNOSIS — C61 Malignant neoplasm of prostate: Secondary | ICD-10-CM | POA: Diagnosis not present

## 2021-07-04 ENCOUNTER — Ambulatory Visit
Admission: RE | Admit: 2021-07-04 | Discharge: 2021-07-04 | Disposition: A | Discharge status: Home / Self Care | Payer: Medicare Other | Source: Ambulatory Visit | Attending: Radiation Oncology | Admitting: Radiation Oncology

## 2021-07-04 DIAGNOSIS — C61 Malignant neoplasm of prostate: Secondary | ICD-10-CM | POA: Diagnosis not present

## 2021-07-05 ENCOUNTER — Other Ambulatory Visit: Payer: Self-pay

## 2021-07-05 ENCOUNTER — Ambulatory Visit
Admission: RE | Admit: 2021-07-05 | Discharge: 2021-07-05 | Disposition: A | Discharge status: Home / Self Care | Payer: Medicare Other | Source: Ambulatory Visit | Attending: Radiation Oncology | Admitting: Radiation Oncology

## 2021-07-05 DIAGNOSIS — C61 Malignant neoplasm of prostate: Secondary | ICD-10-CM | POA: Diagnosis not present

## 2021-07-06 ENCOUNTER — Ambulatory Visit
Admission: RE | Admit: 2021-07-06 | Discharge: 2021-07-06 | Disposition: A | Discharge status: Home / Self Care | Payer: Medicare Other | Source: Ambulatory Visit | Attending: Radiation Oncology | Admitting: Radiation Oncology

## 2021-07-06 DIAGNOSIS — C61 Malignant neoplasm of prostate: Secondary | ICD-10-CM | POA: Diagnosis not present

## 2021-07-07 ENCOUNTER — Other Ambulatory Visit: Payer: Self-pay

## 2021-07-07 ENCOUNTER — Ambulatory Visit
Admission: RE | Admit: 2021-07-07 | Discharge: 2021-07-07 | Disposition: A | Discharge status: Home / Self Care | Payer: Medicare Other | Source: Ambulatory Visit | Attending: Radiation Oncology | Admitting: Radiation Oncology

## 2021-07-07 DIAGNOSIS — C61 Malignant neoplasm of prostate: Secondary | ICD-10-CM | POA: Diagnosis not present

## 2021-07-11 ENCOUNTER — Other Ambulatory Visit: Payer: Self-pay

## 2021-07-11 ENCOUNTER — Ambulatory Visit
Admission: RE | Admit: 2021-07-11 | Discharge: 2021-07-11 | Disposition: A | Discharge status: Home / Self Care | Payer: Medicare Other | Source: Ambulatory Visit | Attending: Radiation Oncology | Admitting: Radiation Oncology

## 2021-07-11 ENCOUNTER — Telehealth: Payer: Self-pay | Admitting: *Deleted

## 2021-07-11 DIAGNOSIS — C61 Malignant neoplasm of prostate: Secondary | ICD-10-CM | POA: Diagnosis not present

## 2021-07-11 NOTE — Telephone Encounter (Signed)
[employer] Disability statement successfully faxed to [phone] at [phone].   Original copy to immediate front entry receptionist registration screening desk for patient pick up in am.   Copy prepared for delivery to (SW) H.I.M. office for record release processing.

## 2021-07-12 ENCOUNTER — Ambulatory Visit
Admission: RE | Admit: 2021-07-12 | Discharge: 2021-07-12 | Disposition: A | Discharge status: Home / Self Care | Payer: Medicare Other | Source: Ambulatory Visit | Attending: Radiation Oncology | Admitting: Radiation Oncology

## 2021-07-12 DIAGNOSIS — C61 Malignant neoplasm of prostate: Secondary | ICD-10-CM | POA: Diagnosis not present

## 2021-07-13 ENCOUNTER — Other Ambulatory Visit: Payer: Self-pay

## 2021-07-13 ENCOUNTER — Ambulatory Visit
Admission: RE | Admit: 2021-07-13 | Discharge: 2021-07-13 | Disposition: A | Discharge status: Home / Self Care | Payer: Medicare Other | Source: Ambulatory Visit | Attending: Radiation Oncology | Admitting: Radiation Oncology

## 2021-07-13 DIAGNOSIS — C61 Malignant neoplasm of prostate: Secondary | ICD-10-CM | POA: Diagnosis not present

## 2021-07-14 ENCOUNTER — Ambulatory Visit
Admission: RE | Admit: 2021-07-14 | Discharge: 2021-07-14 | Disposition: A | Discharge status: Home / Self Care | Payer: Medicare Other | Source: Ambulatory Visit | Attending: Radiation Oncology | Admitting: Radiation Oncology

## 2021-07-14 DIAGNOSIS — C61 Malignant neoplasm of prostate: Secondary | ICD-10-CM | POA: Diagnosis not present

## 2021-07-17 NOTE — Progress Notes (Signed)
°  Radiation Oncology         [phone]) [phone] ________________________________  Name: Andrew Hudson MRN: [id number]  Date: 06/15/2021  DOB: 03/30/1956  3D Planning Note   Prostate Brachytherapy Post-Implant Dosimetry  Diagnosis: 66 y.o. 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  Narrative: On a previous date, Andrew Hudson returned following prostate seed implantation for post implant planning. He underwent CT scan complex simulation to delineate the three-dimensional structures of the pelvis and demonstrate the radiation distribution.  Since that time, the seed localization, and complex isodose planning with dose volume histograms have now been completed.  Results:   Prostate Coverage - The dose of radiation delivered to the 90% or more of the prostate gland (D90) was 128.78% of the prescription dose. This exceeds our goal of greater than 90%. Rectal Sparing - The volume of rectal tissue receiving the prescription dose or higher was 0.0 cc. This falls under our thresholds tolerance of 1.0 cc.  Impression: The prostate seed implant appears to show adequate target coverage and appropriate rectal sparing.  Plan:  The patient will continue to follow with urology for ongoing PSA determinations. I would anticipate a high likelihood for local tumor control with minimal risk for rectal morbidity.  ________________________________  Sheral Apley Tammi Klippel, M.D.

## 2021-07-18 ENCOUNTER — Other Ambulatory Visit: Payer: Self-pay

## 2021-07-18 ENCOUNTER — Ambulatory Visit
Admission: RE | Admit: 2021-07-18 | Discharge: 2021-07-18 | Disposition: A | Discharge status: Home / Self Care | Payer: Medicare Other | Source: Ambulatory Visit | Attending: Radiation Oncology | Admitting: Radiation Oncology

## 2021-07-18 DIAGNOSIS — C61 Malignant neoplasm of prostate: Secondary | ICD-10-CM | POA: Diagnosis present

## 2021-07-19 ENCOUNTER — Ambulatory Visit
Admission: RE | Admit: 2021-07-19 | Discharge: 2021-07-19 | Disposition: A | Discharge status: Home / Self Care | Payer: Medicare Other | Source: Ambulatory Visit | Attending: Radiation Oncology | Admitting: Radiation Oncology

## 2021-07-19 DIAGNOSIS — C61 Malignant neoplasm of prostate: Secondary | ICD-10-CM | POA: Diagnosis not present

## 2021-07-20 ENCOUNTER — Other Ambulatory Visit: Payer: Self-pay

## 2021-07-20 ENCOUNTER — Ambulatory Visit
Admission: RE | Admit: 2021-07-20 | Discharge: 2021-07-20 | Disposition: A | Discharge status: Home / Self Care | Payer: Medicare Other | Source: Ambulatory Visit | Attending: Radiation Oncology | Admitting: Radiation Oncology

## 2021-07-20 DIAGNOSIS — C61 Malignant neoplasm of prostate: Secondary | ICD-10-CM | POA: Diagnosis not present

## 2021-07-21 ENCOUNTER — Ambulatory Visit
Admission: RE | Admit: 2021-07-21 | Discharge: 2021-07-21 | Disposition: A | Discharge status: Home / Self Care | Payer: Medicare Other | Source: Ambulatory Visit | Attending: Radiation Oncology | Admitting: Radiation Oncology

## 2021-07-21 DIAGNOSIS — C61 Malignant neoplasm of prostate: Secondary | ICD-10-CM | POA: Diagnosis not present

## 2021-07-24 ENCOUNTER — Ambulatory Visit
Admission: RE | Admit: 2021-07-24 | Discharge: 2021-07-24 | Disposition: A | Discharge status: Home / Self Care | Payer: Medicare Other | Source: Ambulatory Visit | Attending: Radiation Oncology | Admitting: Radiation Oncology

## 2021-07-24 ENCOUNTER — Other Ambulatory Visit: Payer: Self-pay

## 2021-07-24 DIAGNOSIS — C61 Malignant neoplasm of prostate: Secondary | ICD-10-CM | POA: Diagnosis not present

## 2021-07-25 ENCOUNTER — Ambulatory Visit
Admission: RE | Admit: 2021-07-25 | Discharge: 2021-07-25 | Disposition: A | Discharge status: Home / Self Care | Payer: Medicare Other | Source: Ambulatory Visit | Attending: Radiation Oncology | Admitting: Radiation Oncology

## 2021-07-25 DIAGNOSIS — C61 Malignant neoplasm of prostate: Secondary | ICD-10-CM | POA: Diagnosis not present

## 2021-07-26 ENCOUNTER — Other Ambulatory Visit: Payer: Self-pay

## 2021-07-26 ENCOUNTER — Ambulatory Visit
Admission: RE | Admit: 2021-07-26 | Discharge: 2021-07-26 | Disposition: A | Discharge status: Home / Self Care | Payer: Medicare Other | Source: Ambulatory Visit | Attending: Radiation Oncology | Admitting: Radiation Oncology

## 2021-07-26 DIAGNOSIS — C61 Malignant neoplasm of prostate: Secondary | ICD-10-CM | POA: Diagnosis not present

## 2021-07-27 ENCOUNTER — Ambulatory Visit
Admission: RE | Admit: 2021-07-27 | Discharge: 2021-07-27 | Disposition: A | Discharge status: Home / Self Care | Payer: Medicare Other | Source: Ambulatory Visit | Attending: Radiation Oncology | Admitting: Radiation Oncology

## 2021-07-27 DIAGNOSIS — C61 Malignant neoplasm of prostate: Secondary | ICD-10-CM | POA: Diagnosis not present

## 2021-07-28 ENCOUNTER — Telehealth: Payer: Self-pay | Admitting: Radiation Oncology

## 2021-07-28 ENCOUNTER — Ambulatory Visit
Admission: RE | Admit: 2021-07-28 | Discharge: 2021-07-28 | Disposition: A | Discharge status: Home / Self Care | Payer: Medicare Other | Source: Ambulatory Visit | Attending: Radiation Oncology | Admitting: Radiation Oncology

## 2021-07-28 ENCOUNTER — Other Ambulatory Visit: Payer: Self-pay

## 2021-07-28 ENCOUNTER — Encounter: Payer: Self-pay | Admitting: Urology

## 2021-07-28 DIAGNOSIS — C61 Malignant neoplasm of prostate: Secondary | ICD-10-CM | POA: Diagnosis not present

## 2021-07-28 NOTE — Telephone Encounter (Signed)
Received a return to work form from [employer] for Dr. Tammi Klippel, will return via fax once signed.

## 2021-08-18 ENCOUNTER — Telehealth: Payer: Self-pay | Admitting: *Deleted

## 2021-08-18 NOTE — Telephone Encounter (Signed)
Message per radiation oncology to return call to [place] [phone]) regarding form for [name].  Jake with [employer] called direct extension reporting "August 28, 2021 as date Andrew Hudson states he plans to return to work.  Needing information as soon as possible to get everything lined up for his return.  Please return call."  Message left confirming receipt of request by forms nurses 08/10/2021.  Request 7 to 10 business days/ fourteen calendar to complete forms (due 08/29/2021).  Hope to complete form to return at some point next week.

## 2021-08-29 ENCOUNTER — Telehealth: Payer: Self-pay

## 2021-08-29 ENCOUNTER — Encounter: Payer: Self-pay | Admitting: Urology

## 2021-08-29 NOTE — Telephone Encounter (Signed)
Called patient & spouse x2. I left a voicemail reminder of patient's 10:00am-08/30/21 telephone appointment. I left my extension [phone] & asked that patient return my call prior to appointment time, in hopes of completing the nursing portion of this appointment. I will attempt another call on 08/30/21 around 9:30am.

## 2021-08-29 NOTE — Progress Notes (Signed)
Spoke w/ patient, verified identity, and begin nursing interview. Patient reports, fatigue, sleep disturbance, skin darkening, and severe urinary discomfort, with an I-PSS score of 31. No other issues reported at this time.  Meaningful use complete. No current urinary management medications. Urology appointment- March 14th, 2023.  Reminded patient of his 10:00am-08/30/21 telephone appointment w/ Ashlyn Bruning PA-C. I left my extension [phone] in case patient needs to call. Patient verbalized understanding of information.  Patient contact [phone]

## 2021-08-30 ENCOUNTER — Ambulatory Visit
Admission: RE | Admit: 2021-08-30 | Discharge: 2021-08-30 | Disposition: A | Discharge status: Home / Self Care | Payer: Medicare Other | Source: Ambulatory Visit | Attending: Urology | Admitting: Urology

## 2021-08-30 DIAGNOSIS — C61 Malignant neoplasm of prostate: Secondary | ICD-10-CM

## 2021-08-30 NOTE — Progress Notes (Signed)
°  Radiation Oncology         [phone]) [phone] ________________________________  Name: Andrew Hudson MRN: [id number]  Date: 07/28/2021  DOB: Apr 19, 1956  End of Treatment Note  Diagnosis:   66 y.o. 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     Indication for treatment:  Curative, Definitive Radiotherapy       Radiation treatment dates:    05/29/21- brachytherapy boost 06/22/21 - 07/28/21: IMRT prostate and pelvic nodes  Site/dose:  1. Insertion of radioactive I-125 seeds into the prostate gland; 110 Gy, boost therapy. 2. The prostate, seminal vesicles, and pelvic lymph nodes were treated to 45 Gy in 25 fractions of 1.8 Gy   Beams/energy:  1. A total of 19 needles were used to deposit 69 seeds in the prostate gland. The individual seed activity was 0.382 mCi 2. The prostate, seminal vesicles, and pelvic lymph nodes were treated using VMAT intensity modulated radiotherapy delivering 6 megavolt photons. Image guidance was performed with CB-CT studies prior to each fraction. He was immobilized with a body fix lower extremity mold.   Narrative: The patient tolerated radiation treatment relatively well with only minor urinary irritation and modest fatigue.  He did report increased frequency, urgency, weak flow of stream, intermittency, incomplete bladder emptying and dysuria despite taking Flomax twice daily.  He also reported loose stool/diarrhea but denied abdominal pain, nausea or vomiting.  Plan: The patient has completed radiation treatment. He will return to radiation oncology clinic for routine followup in one month. I advised him to call or return sooner if he has any questions or concerns related to his recovery or treatment. ________________________________  Sheral Apley. Tammi Klippel, M.D.

## 2021-08-30 NOTE — Progress Notes (Signed)
Radiation Oncology         [phone]) [phone] ________________________________  Name: Andrew Hudson MRN: [id number]  Date: 08/30/2021  DOB: 1955/08/25  Post Treatment Note  CC: Aletha Halim., PA-C  [name], MD  Diagnosis:   66 y.o. 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  Interval Since Last Radiation:  5.5 weeks  05/29/21- Insertion of radioactive I-125 seeds into the prostate gland; 110 Gy, boost therapy. 06/22/21 - 07/28/21: The prostate, seminal vesicles, and pelvic lymph nodes were treated to 45 Gy in 25 fractions of 1.8 Gy  Narrative:  I spoke with the patient to conduct his routine scheduled 1 month follow up visit via telephone to spare the patient unnecessary potential exposure in the healthcare setting during the current COVID-19 pandemic.  The patient was notified in advance and gave permission to proceed with this visit format.  He tolerated radiation treatment relatively well with only minor urinary irritation and modest fatigue.  He did report increased frequency, urgency, weak flow of stream, intermittency, incomplete bladder emptying and dysuria despite taking Flomax twice daily.  He also reported loose stool/diarrhea but denied abdominal pain, nausea or vomiting.                              On review of systems, the patient states that he continues with persistent LUTS, namely, nocturia 4-5 times per night, frequency, urgency, weaker flow of stream, incomplete bladder emptying and dysuria but feels like he is noting some very gradual improvement.  He is taking Flomax once daily as prescribed since the double dose of Flomax did not seem to have any additional impact.  He denies gross hematuria, fever, chills or night sweats.  He reports that his bowels are pretty much back to normal at this point and he denies abdominal pain, nausea, vomiting, diarrhea or constipation.  He does have some significant hyperpigmentation  on the penis as well as smaller areas just above the pubis in the front and also on his back.  He denies any skin breakdown or wounds.  He does still have some fatigue but has recently gone back to working full-time and overall, he is pleased with his progress to date.  ALLERGIES:  has No Known Allergies.  Meds: Current Outpatient Medications  Medication Sig Dispense Refill   docusate sodium (COLACE) 100 MG capsule Take 1 capsule (100 mg total) by mouth daily as needed for up to 30 doses. 30 capsule 0   ibuprofen (ADVIL) 200 MG tablet Take 400 mg by mouth every 6 (six) hours as needed.     meloxicam (MOBIC) 7.5 MG tablet Take 7.5 mg by mouth as needed for pain.     oxyCODONE-acetaminophen (PERCOCET) 5-325 MG tablet Take 1 tablet by mouth every 4 (four) hours as needed for up to 20 doses for severe pain. 20 tablet 0   No current facility-administered medications for this encounter.    Physical Findings:  vitals were not taken for this visit.  Pain Assessment Pain Score: 0-No pain/10 Unable to assess due to telephone follow-up visit format.  Lab Findings: Lab Results  Component Value Date   WBC 14.1 (H) 05/19/2021   HGB 16.7 05/19/2021   HCT 51.2 05/19/2021   MCV 93.4 05/19/2021   PLT 228 05/19/2021     Radiographic Findings: No results found.  Impression/Plan: 1. 66 y.o. gentleman with Stage T2c  adenocarcinoma of the prostate with Gleason score of 4+3, and PSA of 36.2. He will continue to follow up with urology for ongoing PSA determinations and has an appointment scheduled with Dr. Abner Greenspan on 09/26/2021.  He will continue taking the Flomax daily as prescribed and will consider trying some OTC AZO to help with the dysuria, frequency and urgency.  He understands what to expect with regards to PSA monitoring going forward. I will look forward to following his response to treatment via correspondence with urology, and would be happy to continue to participate in his care if clinically  indicated. I talked to the patient about what to expect in the future, including his risk for erectile dysfunction and rectal bleeding. I encouraged him to call or return to the office if he has any questions regarding his previous radiation or possible radiation side effects. He was comfortable with this plan and will follow up as needed.     Nicholos Johns, PA-C

## 2021-10-02 ENCOUNTER — Telehealth: Payer: Self-pay | Admitting: *Deleted

## 2021-10-03 ENCOUNTER — Telehealth: Payer: Self-pay | Admitting: *Deleted

## 2021-10-04 ENCOUNTER — Telehealth: Payer: Self-pay | Admitting: Adult Health

## 2021-10-04 NOTE — Telephone Encounter (Signed)
.  Called patient to schedule appointment per 3/21 inbasket, patient is aware of date and time.   ?

## 2021-11-29 ENCOUNTER — Telehealth: Payer: Self-pay | Admitting: Adult Health

## 2021-11-29 NOTE — Telephone Encounter (Signed)
Rescheduled appointment per provider template. Left message. ?

## 2021-12-08 ENCOUNTER — Inpatient Hospital Stay: Payer: Medicare Other | Attending: Adult Health | Admitting: Adult Health

## 2021-12-08 ENCOUNTER — Other Ambulatory Visit: Payer: Self-pay

## 2021-12-08 ENCOUNTER — Encounter: Payer: Self-pay | Admitting: Adult Health

## 2021-12-08 VITALS — BP 120/72 | HR 76 | Temp 97.7°F | Resp 18 | Ht 68.0 in | Wt 183.6 lb

## 2021-12-08 DIAGNOSIS — C61 Malignant neoplasm of prostate: Secondary | ICD-10-CM | POA: Diagnosis present

## 2021-12-08 DIAGNOSIS — Z85828 Personal history of other malignant neoplasm of skin: Secondary | ICD-10-CM | POA: Diagnosis not present

## 2021-12-08 DIAGNOSIS — Z803 Family history of malignant neoplasm of breast: Secondary | ICD-10-CM | POA: Insufficient documentation

## 2021-12-08 DIAGNOSIS — Z87891 Personal history of nicotine dependence: Secondary | ICD-10-CM | POA: Insufficient documentation

## 2021-12-08 DIAGNOSIS — Q600 Renal agenesis, unilateral: Secondary | ICD-10-CM | POA: Diagnosis not present

## 2021-12-08 NOTE — Progress Notes (Signed)
SURVIVORSHIP VISIT:   BRIEF ONCOLOGIC HISTORY:  Oncology History  Malignant neoplasm of prostate ([place])  01/03/2021 Cancer Staging   Staging form: Prostate, AJCC 8th Edition - Clinical stage from 01/03/2021: Stage IIIA (cT2c, cN0, cM0, PSA: 36.2, Grade Group: 3) - Signed by Freeman Caldron, PA-C on 03/08/2021 Histopathologic type: Adenocarcinoma, NOS Stage prefix: Initial diagnosis Prostate specific antigen (PSA) range: 20 or greater Gleason primary pattern: 4 Gleason secondary pattern: 3 Gleason score: 7 Histologic grading system: 5 grade system Number of biopsy cores examined: 12 Number of biopsy cores positive: 11 Location of positive needle core biopsies: Both sides    03/08/2021 Initial Diagnosis   Malignant neoplasm of prostate ([place])    05/29/2021 -  Radiation Therapy   Insertion of radioactive I-125 seeds into the prostate gland; 110 Gy, boost therapy.   06/22/2021 - 07/28/2021 Radiation Therapy   The prostate, seminal vesicles, and pelvic lymph nodes were treated to 45 Gy in 25 fractions of 1.8 Gy     INTERVAL HISTORY:  Mr. Nebel to review her survivorship care plan detailing his treatment course for prostate cancer, as well as monitoring long-term side effects of that treatment, education regarding health maintenance, screening, and overall wellness and health promotion.     Overall, Mr. Tickner reports feeling quite well.  He has had some erectile dysfunction however notes that it is not a tremendous still.  He says that his struggles with urinating in having an approximate IPSS score of 20 has been difficult for him.  He continues on Flomax daily for this and wonders if it will ever improve.  REVIEW OF SYSTEMS:  Review of Systems  Constitutional:  Negative for appetite change, chills, fatigue, fever and unexpected weight change.  HENT:   Negative for hearing loss, lump/mass and trouble swallowing.   Eyes:  Negative for eye problems and icterus.  Respiratory:  Negative  for chest tightness, cough and shortness of breath.   Cardiovascular:  Negative for chest pain, leg swelling and palpitations.  Gastrointestinal:  Negative for abdominal distention, abdominal pain, constipation, diarrhea, nausea and vomiting.  Endocrine: Negative for hot flashes.  Genitourinary:  Negative for difficulty urinating.   Musculoskeletal:  Negative for arthralgias.  Skin:  Negative for itching and rash.  Neurological:  Negative for dizziness, extremity weakness, headaches and numbness.  Hematological:  Negative for adenopathy. Does not bruise/bleed easily.  Psychiatric/Behavioral:  Negative for depression. The patient is not nervous/anxious.   Breast: Denies any new nodularity, masses, tenderness, nipple changes, or nipple discharge.   ONCOLOGY TREATMENT TEAM:  1.  Urologist: Dr. Abner Greenspan at [facility] urology 2.  Radiation oncologist: Dr. Tammi Klippel  PAST MEDICAL/SURGICAL HISTORY:  Past Medical History:  Diagnosis Date   Cancer [facility])    Concussion    in j[employer] high school no residual   Solitary kidney, congenital    pt born with 1 kidney, not sure which side kidney is on   Past Surgical History:  Procedure Laterality Date   basal cell carcinoma areas removed     last done 2016 mohs procedure on lip   CYSTOSCOPY  05/29/2021   Procedure: CYSTOSCOPY FLEXIBLE;  Surgeon: [name], MD;  Location: [facility];  Service: Urology;;  No seeds detected in bladder per Dr. Abner Greenspan   RADIOACTIVE SEED IMPLANT N/A 05/29/2021   Procedure: RADIOACTIVE SEED IMPLANT/BRACHYTHERAPY IMPLANT;  Surgeon: [name], MD;  Location: [facility];  Service: Urology;  Laterality: N/A;   SPACE OAR INSTILLATION N/A 05/29/2021  Procedure: SPACE OAR INSTILLATION;  Surgeon: [name], MD;  Location: [facility];  Service: Urology;  Laterality: N/A;   TONSILLECTOMY     age 32     ALLERGIES:  No Known Allergies   CURRENT MEDICATIONS:  Outpatient Encounter  Medications as of 12/08/2021  Medication Sig   tamsulosin (FLOMAX) 0.4 MG CAPS capsule Take 1 capsule by mouth daily.   ibuprofen (ADVIL) 200 MG tablet Take 400 mg by mouth every 6 (six) hours as needed.   meloxicam (MOBIC) 7.5 MG tablet Take 7.5 mg by mouth as needed for pain.   sildenafil (VIAGRA) 100 MG tablet Take 100 mg by mouth daily.   [DISCONTINUED] docusate sodium (COLACE) 100 MG capsule Take 1 capsule (100 mg total) by mouth daily as needed for up to 30 doses.   [DISCONTINUED] oxyCODONE-acetaminophen (PERCOCET) 5-325 MG tablet Take 1 tablet by mouth every 4 (four) hours as needed for up to 20 doses for severe pain.   No facility-administered encounter medications on file as of 12/08/2021.     ONCOLOGIC FAMILY HISTORY:  Family History  Problem Relation Age of Onset   Breast cancer Sister      GENETIC COUNSELING/TESTING: Not at this time  SOCIAL HISTORY:  Social History   Socioeconomic History   Marital status: Married    Spouse name: Not on file   Number of children: Not on file   Years of education: Not on file   Highest education level: Not on file  Occupational History   Not on file  Tobacco Use   Smoking status: Former    Packs/day: 1.00    Years: 30.00    Pack years: 30.00    Types: Cigarettes    Quit date: 2016    Years since quitting: 7.4   Smokeless tobacco: Never  Substance and Sexual Activity   Alcohol use: Yes    Comment: occ   Drug use: Not Currently    Types: Marijuana    Comment: marijuana last used october 2022 per pt   Sexual activity: Yes  Other Topics Concern   Not on file  Social History Narrative   Not on file   Social Determinants of Health   Financial Resource Strain: Not on file  Food Insecurity: Not on file  Transportation Needs: Not on file  Physical Activity: Not on file  Stress: Not on file  Social Connections: Not on file  Intimate Partner Violence: Not on file     OBSERVATIONS/OBJECTIVE:   LABORATORY DATA:  None  for this visit.  DIAGNOSTIC IMAGING:  None for this visit.      ASSESSMENT AND PLAN:  Mr.. Bruun is a pleasant 66 y.o. male with Stage 3 prostate cancer treated with brachytherapy and IMRT.  He  presents to the Survivorship Clinic for our initial meeting and routine follow-up post-completion of treatment for prostate cancer.    1.  Prostate cancer:  Mr. Skalicky is continuing to recover from definitive treatment for prostate cancer.  He will follow-up with Dr. Abner Greenspan as recommended.  He continues on Flomax and I counseled him that the urinary issues should slowly improve however they may never completely resolve.  Today, a comprehensive survivorship care plan and treatment summary was reviewed with the patient today detailing his prostate cancer diagnosis, treatment course, potential late/long-term effects of treatment, appropriate follow-up care with recommendations for the future, and patient education resources.  A copy of this summary, along with a letter will be sent to the  patient's primary care provider via mail/fax/In Basket message after today's visit.    2. Cancer screening:  Due to Mr. Brownlow history and age, he should receive screening for skin cancers, colon cancer, and lung cancer screening with low-dose chest CT.  The information and recommendations are listed on the patient's comprehensive care plan/treatment summary and were reviewed in detail with the patient.    3. Health maintenance and wellness promotion: Mr. Isabell was encouraged to consume 5-7 servings of fruits and vegetables per day. We reviewed the "Nutrition Rainbow" handout. He was also encouraged to engage in moderate to vigorous exercise for 30 minutes per day most days of the week. We discussed the [employer] fitness program, which is designed for cancer survivors to help them become more physically fit after cancer treatments.  He was instructed to limit his alcohol consumption and continue to abstain from tobacco  use.     4. Support services/counseling: It is not uncommon for this period of the patient's cancer care trajectory to be one of many emotions and stressors. He was given information regarding our available services and encouraged to contact me with any questions or for help enrolling in any of our support group/programs.    Follow up instructions:    -Return to cancer center PRN -Follow-up with urology as scheduled   The patient was provided an opportunity to ask questions and all were answered. The patient agreed with the plan and demonstrated an understanding of the instructions.   Total encounter time:30 minutes*in face-to-face visit time, chart review, lab review, care coordination, order entry, and documentation of the encounter time.    Wilber Bihari, NP 12/08/21 3:47 PM Medical Oncology and Hematology [facility] [place], [place] [address] Tel. [phone]    Fax. [phone]  *Total Encounter Time as defined by the Centers for Medicare and Medicaid Services includes, in addition to the face-to-face time of a patient visit (documented in the note above) non-face-to-face time: obtaining and reviewing outside history, ordering and reviewing medications, tests or procedures, care coordination (communications with other health care professionals or caregivers) and documentation in the medical record.

## 2022-04-07 IMAGING — DX DG CHEST 2V
2 series · 2 of 2 positions shown · non-contrast
Comparison: 09/14/2020

CLINICAL DATA: Preoperative evaluation for seed implantation,
prostate cancer, former smoker

EXAM:
CHEST - 2 VIEW

[chest pa]
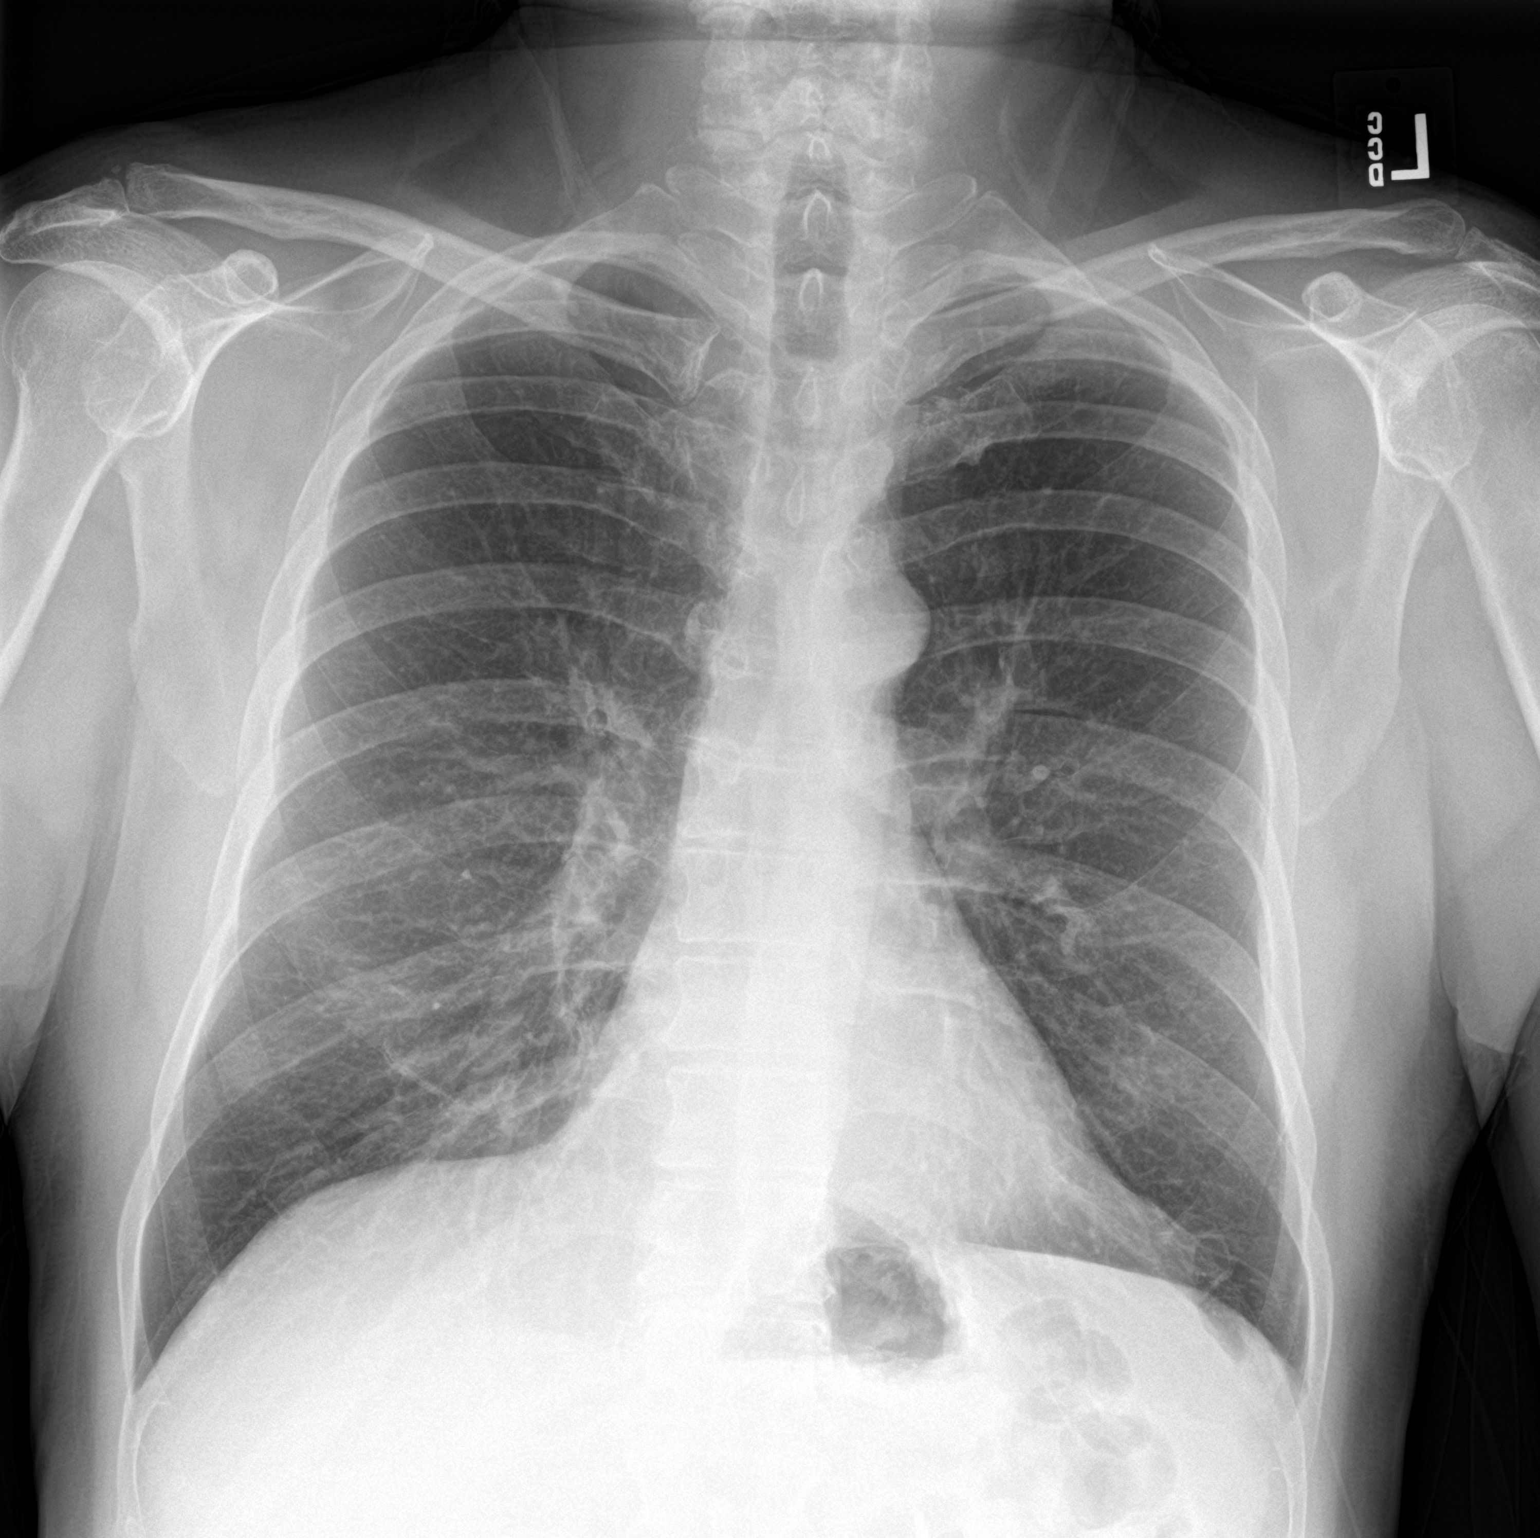

[chest lat]
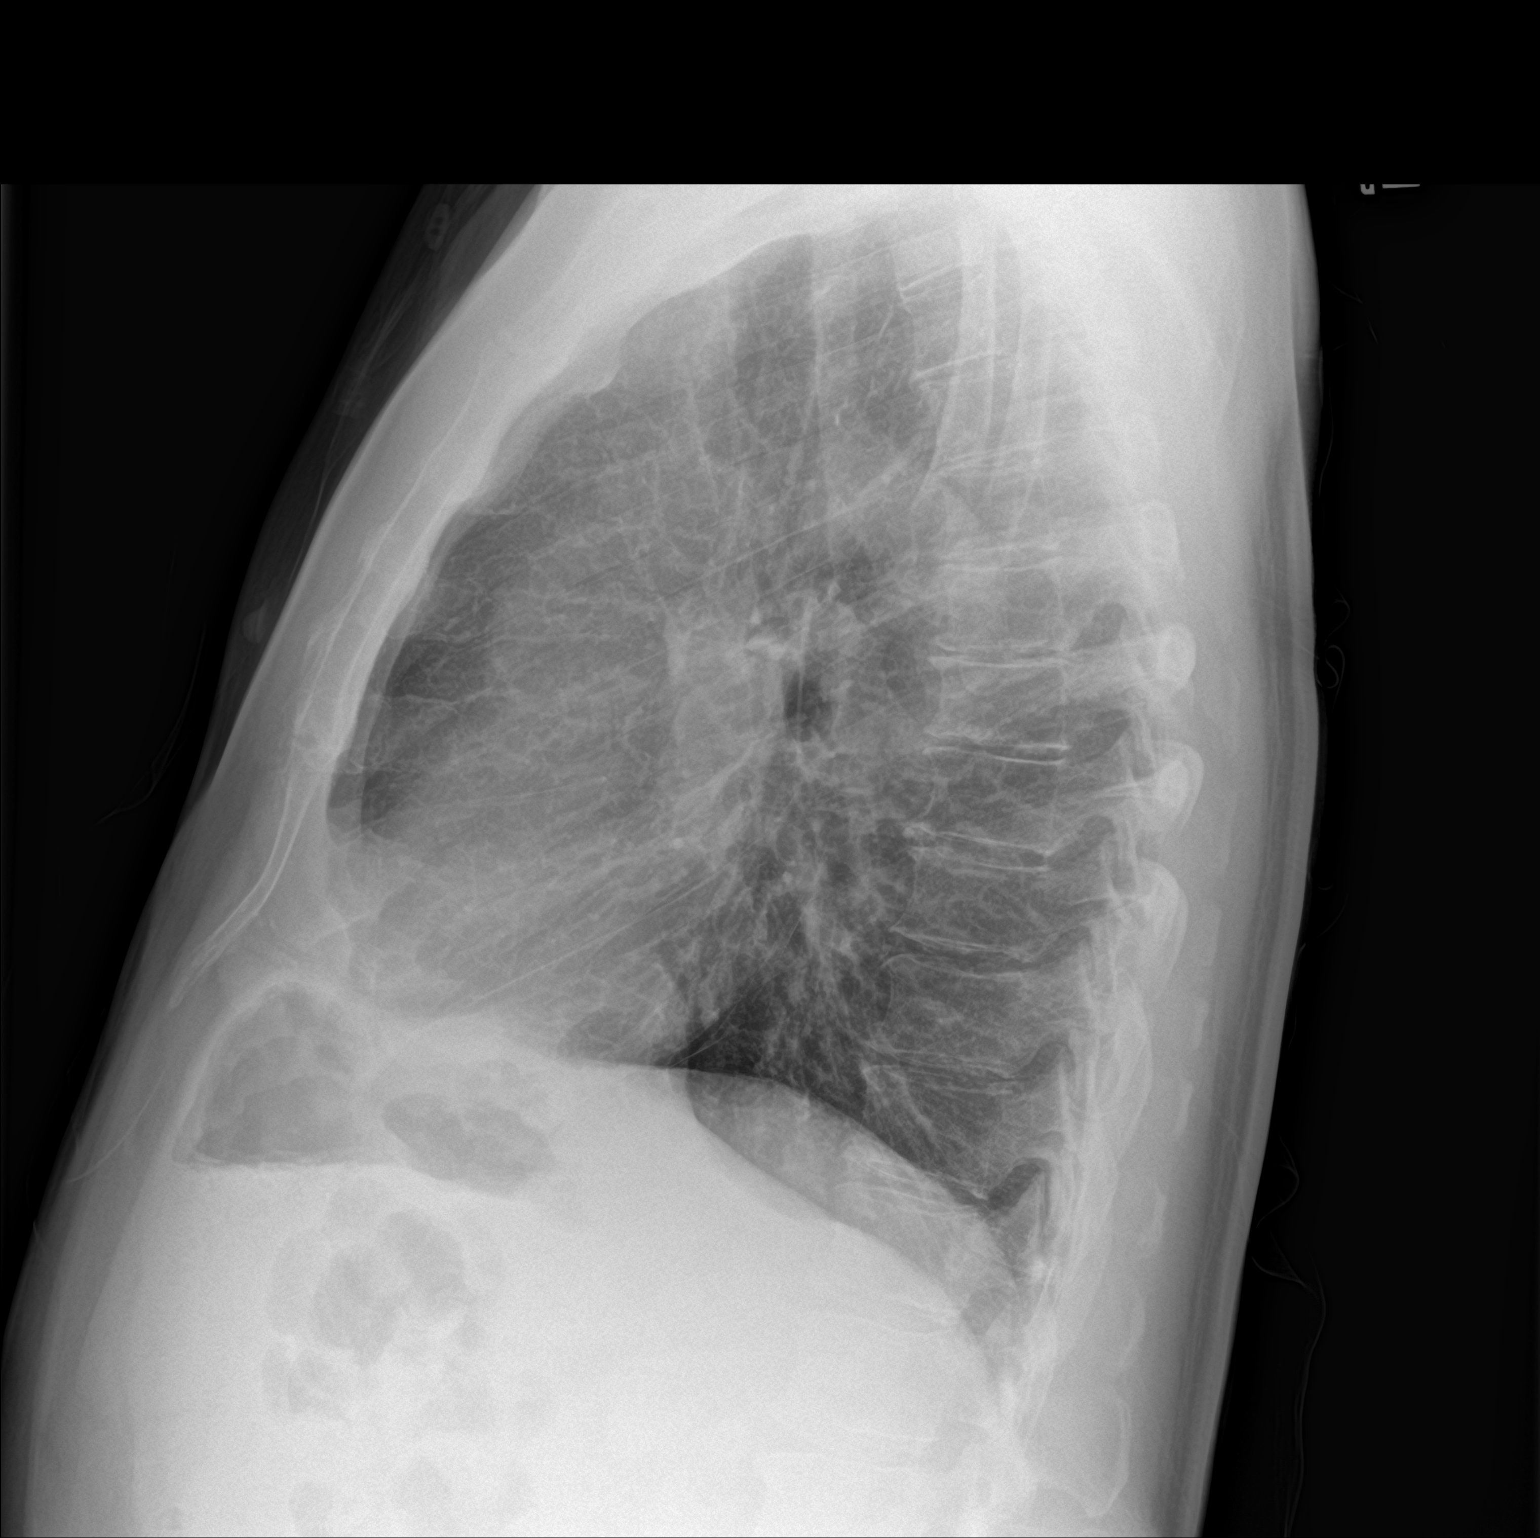

[2 of 2 positions shown; findings below may reference images not displayed]

FINDINGS: Normal heart size, mediastinal contours, and pulmonary vascularity.

Lungs clear.

No pulmonary infiltrate, pleural effusion, or pneumothorax.

Osseous structures unremarkable.
IMPRESSION: No acute abnormalities.

## 2023-08-23 ENCOUNTER — Telehealth (INDEPENDENT_AMBULATORY_CARE_PROVIDER_SITE_OTHER): Payer: Self-pay | Admitting: Otolaryngology

## 2023-08-23 NOTE — Telephone Encounter (Signed)
 Reminder Call: Date: 08/26/2023 Status: Sch  Time: 2:20 PM [address] [place], [place] [address]  08/19/2023 3:08 PM - TEXT YES

## 2023-08-26 ENCOUNTER — Encounter (INDEPENDENT_AMBULATORY_CARE_PROVIDER_SITE_OTHER): Payer: Self-pay

## 2023-08-26 ENCOUNTER — Ambulatory Visit (INDEPENDENT_AMBULATORY_CARE_PROVIDER_SITE_OTHER): Payer: Medicare Other

## 2023-08-26 VITALS — BP 106/70 | HR 59 | Ht 68.0 in | Wt 180.0 lb

## 2023-08-26 DIAGNOSIS — J343 Hypertrophy of nasal turbinates: Secondary | ICD-10-CM | POA: Diagnosis not present

## 2023-08-26 DIAGNOSIS — J310 Chronic rhinitis: Secondary | ICD-10-CM | POA: Diagnosis not present

## 2023-08-26 DIAGNOSIS — R0981 Nasal congestion: Secondary | ICD-10-CM | POA: Diagnosis not present

## 2023-08-27 DIAGNOSIS — J343 Hypertrophy of nasal turbinates: Secondary | ICD-10-CM | POA: Insufficient documentation

## 2023-08-27 DIAGNOSIS — J310 Chronic rhinitis: Secondary | ICD-10-CM | POA: Insufficient documentation

## 2023-08-27 NOTE — Progress Notes (Signed)
Patient ID: Andrew Hudson, male   DOB: [birth year], 68 y.o.   MRN: [id number]  Follow-up: Chronic nasal obstruction  HPI: The patient is a 68 year old male who returns today for his follow-up evaluation.  He was previously seen for chronic nasal obstruction.  He was noted to have significant nasal septal deviation and bilateral inferior turbinate hypertrophy.  He underwent septoplasty and bilateral turbinate reduction surgery in July 2024.  The patient returns today reporting significant improvement in his nasal breathing.  He has noted only mild intermittent nasal congestion during the allergy seasons.  He denies any recent sinusitis.  Exam: General: Communicates without difficulty, well nourished, no acute distress. Head: Normocephalic, no evidence injury, no tenderness, facial buttresses intact without stepoff. Face/sinus: No tenderness to palpation and percussion. Facial movement is normal and symmetric. Eyes: PERRL, EOMI. No scleral icterus, conjunctivae clear. Neuro: CN II exam reveals vision grossly intact.  No nystagmus at any point of gaze. Ears: Auricles well formed without lesions.  Ear canals are intact without mass or lesion.  No erythema or edema is appreciated.  The TMs are intact without fluid. Nose: External evaluation reveals normal support and skin without lesions.  Dorsum is intact.  Anterior rhinoscopy reveals congested mucosa over anterior aspect of inferior turbinates and intact septum.  No purulence noted. Oral:  Oral cavity and oropharynx are intact, symmetric, without erythema or edema.  Mucosa is moist without lesions. Neck: Full range of motion without pain.  There is no significant lymphadenopathy.  No masses palpable.  Thyroid bed within normal limits to palpation.  Parotid glands and submandibular glands equal bilaterally without mass.  Trachea is midline. Neuro:  CN 2-12 grossly intact.   Assessment: 1.  Chronic rhinitis with mild nasal mucosal congestion. 2.  His septum and  turbinates are well-healed.  His nasal passageways are patent bilaterally.  Plan: 1.  The physical exam findings are reviewed with the patient. 2.  Nasal saline irrigation and Flonase as needed. 3.  The patient is encouraged to call with any questions or concerns.

## 2024-03-12 ENCOUNTER — Encounter: Payer: Self-pay | Admitting: General Surgery

## 2024-03-13 ENCOUNTER — Other Ambulatory Visit: Payer: Self-pay

## 2024-03-13 DIAGNOSIS — M26603 Bilateral temporomandibular joint disorder, unspecified: Secondary | ICD-10-CM

## 2024-03-24 ENCOUNTER — Ambulatory Visit
Admission: RE | Admit: 2024-03-24 | Discharge: 2024-03-24 | Disposition: A | Discharge status: Home / Self Care | Source: Ambulatory Visit

## 2024-03-24 DIAGNOSIS — M26603 Bilateral temporomandibular joint disorder, unspecified: Secondary | ICD-10-CM

## 2024-04-24 ENCOUNTER — Other Ambulatory Visit (HOSPITAL_BASED_OUTPATIENT_CLINIC_OR_DEPARTMENT_OTHER): Payer: Self-pay

## 2024-04-24 MED ORDER — FLUZONE HIGH-DOSE 0.5 ML IM SUSY
0.5000 mL | PREFILLED_SYRINGE | Freq: Once | INTRAMUSCULAR | 0 refills | Status: AC
  Filled 2024-04-24: qty 0.5, 1d supply, fill #0
